# Patient Record
Sex: Male | Born: 1965
Health system: Southern US, Community
[De-identification: ages and names within clinical notes are randomized; demographics above are authoritative.]

## PROBLEM LIST (undated history)

## (undated) DIAGNOSIS — H409 Unspecified glaucoma: Secondary | ICD-10-CM

## (undated) DIAGNOSIS — Z86718 Personal history of other venous thrombosis and embolism: Secondary | ICD-10-CM

## (undated) DIAGNOSIS — G4730 Sleep apnea, unspecified: Secondary | ICD-10-CM

## (undated) DIAGNOSIS — E119 Type 2 diabetes mellitus without complications: Secondary | ICD-10-CM

## (undated) DIAGNOSIS — I10 Essential (primary) hypertension: Secondary | ICD-10-CM

## (undated) DIAGNOSIS — F419 Anxiety disorder, unspecified: Secondary | ICD-10-CM

## (undated) DIAGNOSIS — D689 Coagulation defect, unspecified: Secondary | ICD-10-CM

## (undated) DIAGNOSIS — Z86711 Personal history of pulmonary embolism: Secondary | ICD-10-CM

## (undated) DIAGNOSIS — K5720 Diverticulitis of large intestine with perforation and abscess without bleeding: Secondary | ICD-10-CM

## (undated) HISTORY — DX: Unspecified glaucoma: H40.9

## (undated) HISTORY — PX: MANDIBLE FRACTURE SURGERY: SHX706

## (undated) HISTORY — DX: Personal history of other venous thrombosis and embolism: Z86.718

## (undated) HISTORY — DX: Personal history of pulmonary embolism: Z86.711

## (undated) HISTORY — PX: FRACTURE SURGERY: SHX138

---

## 2006-05-13 HISTORY — PX: IVC FILTER INSERTION: CATH118245

## 2006-10-07 ENCOUNTER — Other Ambulatory Visit: Payer: Self-pay

## 2006-10-07 ENCOUNTER — Inpatient Hospital Stay: Payer: Self-pay | Admitting: Internal Medicine

## 2006-10-07 ENCOUNTER — Ambulatory Visit: Payer: Self-pay | Admitting: Internal Medicine

## 2006-10-12 ENCOUNTER — Ambulatory Visit: Payer: Self-pay | Admitting: Internal Medicine

## 2006-10-23 ENCOUNTER — Ambulatory Visit: Payer: Self-pay | Admitting: Internal Medicine

## 2006-11-11 ENCOUNTER — Ambulatory Visit: Payer: Self-pay | Admitting: Internal Medicine

## 2006-11-18 ENCOUNTER — Ambulatory Visit: Payer: Self-pay | Admitting: Vascular Surgery

## 2007-04-13 ENCOUNTER — Ambulatory Visit: Payer: Self-pay | Admitting: Internal Medicine

## 2007-04-21 ENCOUNTER — Ambulatory Visit: Payer: Self-pay | Admitting: Internal Medicine

## 2007-05-14 ENCOUNTER — Ambulatory Visit: Payer: Self-pay | Admitting: Internal Medicine

## 2007-10-21 ENCOUNTER — Ambulatory Visit: Payer: Self-pay | Admitting: Family Medicine

## 2008-06-13 ENCOUNTER — Ambulatory Visit: Payer: Self-pay | Admitting: Internal Medicine

## 2008-06-27 ENCOUNTER — Inpatient Hospital Stay: Payer: Self-pay | Admitting: Internal Medicine

## 2008-07-05 ENCOUNTER — Ambulatory Visit: Payer: Self-pay | Admitting: Internal Medicine

## 2008-07-11 ENCOUNTER — Ambulatory Visit: Payer: Self-pay | Admitting: Internal Medicine

## 2008-08-11 ENCOUNTER — Ambulatory Visit: Payer: Self-pay | Admitting: Internal Medicine

## 2008-09-10 ENCOUNTER — Ambulatory Visit: Payer: Self-pay | Admitting: Internal Medicine

## 2008-10-11 ENCOUNTER — Ambulatory Visit: Payer: Self-pay | Admitting: Internal Medicine

## 2008-11-10 ENCOUNTER — Ambulatory Visit: Payer: Self-pay | Admitting: Internal Medicine

## 2008-12-11 ENCOUNTER — Ambulatory Visit: Payer: Self-pay | Admitting: Internal Medicine

## 2009-01-11 ENCOUNTER — Ambulatory Visit: Payer: Self-pay | Admitting: Internal Medicine

## 2009-02-10 ENCOUNTER — Ambulatory Visit: Payer: Self-pay | Admitting: Internal Medicine

## 2009-03-13 ENCOUNTER — Ambulatory Visit: Payer: Self-pay | Admitting: Internal Medicine

## 2009-04-12 ENCOUNTER — Ambulatory Visit: Payer: Self-pay | Admitting: Internal Medicine

## 2009-05-13 ENCOUNTER — Ambulatory Visit: Payer: Self-pay | Admitting: Internal Medicine

## 2009-06-13 ENCOUNTER — Ambulatory Visit: Payer: Self-pay | Admitting: Internal Medicine

## 2009-07-11 ENCOUNTER — Ambulatory Visit: Payer: Self-pay | Admitting: Internal Medicine

## 2009-08-11 ENCOUNTER — Ambulatory Visit: Payer: Self-pay | Admitting: Internal Medicine

## 2009-09-10 ENCOUNTER — Ambulatory Visit: Payer: Self-pay | Admitting: Internal Medicine

## 2009-10-11 ENCOUNTER — Ambulatory Visit: Payer: Self-pay | Admitting: Internal Medicine

## 2009-11-10 ENCOUNTER — Ambulatory Visit: Payer: Self-pay | Admitting: Internal Medicine

## 2009-12-11 ENCOUNTER — Ambulatory Visit: Payer: Self-pay | Admitting: Internal Medicine

## 2010-01-11 ENCOUNTER — Ambulatory Visit: Payer: Self-pay | Admitting: Internal Medicine

## 2010-02-10 ENCOUNTER — Ambulatory Visit: Payer: Self-pay | Admitting: Internal Medicine

## 2010-03-13 ENCOUNTER — Ambulatory Visit: Payer: Self-pay | Admitting: Internal Medicine

## 2010-04-12 ENCOUNTER — Ambulatory Visit: Payer: Self-pay | Admitting: Internal Medicine

## 2011-10-14 ENCOUNTER — Emergency Department: Payer: Self-pay | Admitting: Emergency Medicine

## 2011-10-14 LAB — COMPREHENSIVE METABOLIC PANEL
Albumin: 3.9 g/dL (ref 3.4–5.0)
Alkaline Phosphatase: 64 U/L (ref 50–136)
Anion Gap: 8 (ref 7–16)
BUN: 13 mg/dL (ref 7–18)
Bilirubin,Total: 1.6 mg/dL — ABNORMAL HIGH (ref 0.2–1.0)
Calcium, Total: 9 mg/dL (ref 8.5–10.1)
Chloride: 101 mmol/L (ref 98–107)
Co2: 31 mmol/L (ref 21–32)
Creatinine: 1.31 mg/dL — ABNORMAL HIGH (ref 0.60–1.30)
EGFR (African American): >60
EGFR (Non-African Amer.): >60
Glucose: 105 mg/dL — ABNORMAL HIGH (ref 65–99)
Osmolality: 280 (ref 275–301)
Potassium: 3.4 mmol/L — ABNORMAL LOW (ref 3.5–5.1)
SGOT(AST): 36 U/L (ref 15–37)
SGPT (ALT): 33 U/L
Sodium: 140 mmol/L (ref 136–145)
Total Protein: 7.4 g/dL (ref 6.4–8.2)

## 2011-10-14 LAB — CBC
HCT: 45.5 % (ref 40.0–52.0)
HGB: 15 g/dL (ref 13.0–18.0)
MCH: 28.9 pg (ref 26.0–34.0)
MCV: 88 fL (ref 80–100)
Platelet: 174 10*3/uL (ref 150–440)
RBC: 5.2 10*6/uL (ref 4.40–5.90)
RDW: 15.5 % — ABNORMAL HIGH (ref 11.5–14.5)

## 2011-10-14 LAB — PROTIME-INR
INR: 2.5
Prothrombin Time: 27 secs — ABNORMAL HIGH (ref 11.5–14.7)

## 2012-08-11 ENCOUNTER — Ambulatory Visit: Payer: Self-pay | Admitting: Nurse Practitioner

## 2012-08-21 ENCOUNTER — Other Ambulatory Visit: Payer: Self-pay | Admitting: Gastroenterology

## 2013-06-29 ENCOUNTER — Ambulatory Visit: Payer: Self-pay | Admitting: Urology

## 2013-07-09 ENCOUNTER — Ambulatory Visit: Payer: Self-pay | Admitting: Physician Assistant

## 2013-07-11 HISTORY — PX: UMBILICAL GRANULOMA EXCISION: SHX2597

## 2013-07-12 ENCOUNTER — Ambulatory Visit: Payer: Self-pay | Admitting: Physician Assistant

## 2013-07-13 LAB — WOUND CULTURE

## 2013-07-21 ENCOUNTER — Ambulatory Visit: Payer: Self-pay | Admitting: Anesthesiology

## 2013-07-21 LAB — CBC
HCT: 46.4 % (ref 40.0–52.0)
HGB: 15.2 g/dL (ref 13.0–18.0)
MCH: 27.7 pg (ref 26.0–34.0)
MCHC: 32.8 g/dL (ref 32.0–36.0)
MCV: 84 fL (ref 80–100)
Platelet: 188 10*3/uL (ref 150–440)
RBC: 5.49 10*6/uL (ref 4.40–5.90)
RDW: 15.6 % — AB (ref 11.5–14.5)
WBC: 5.3 10*3/uL (ref 3.8–10.6)

## 2013-07-21 LAB — BASIC METABOLIC PANEL
Anion Gap: 5 — ABNORMAL LOW (ref 7–16)
BUN: 18 mg/dL (ref 7–18)
CREATININE: 1.51 mg/dL — AB (ref 0.60–1.30)
Calcium, Total: 9.5 mg/dL (ref 8.5–10.1)
Chloride: 105 mmol/L (ref 98–107)
Co2: 28 mmol/L (ref 21–32)
GFR CALC NON AF AMER: 55 — AB
Glucose: 80 mg/dL (ref 65–99)
Osmolality: 277 (ref 275–301)
POTASSIUM: 3.7 mmol/L (ref 3.5–5.1)
Sodium: 138 mmol/L (ref 136–145)

## 2013-07-23 ENCOUNTER — Ambulatory Visit: Payer: Self-pay | Admitting: Specialist

## 2013-07-23 LAB — PROTIME-INR
INR: 1.1
Prothrombin Time: 14.5 secs (ref 11.5–14.7)

## 2013-07-26 LAB — PATHOLOGY REPORT

## 2014-02-14 ENCOUNTER — Emergency Department: Payer: Self-pay | Admitting: Emergency Medicine

## 2014-02-14 LAB — COMPREHENSIVE METABOLIC PANEL
ALBUMIN: 3.9 g/dL (ref 3.4–5.0)
ALK PHOS: 64 U/L
ANION GAP: 6 — AB (ref 7–16)
BUN: 14 mg/dL (ref 7–18)
Bilirubin,Total: 1.3 mg/dL — ABNORMAL HIGH (ref 0.2–1.0)
CHLORIDE: 103 mmol/L (ref 98–107)
CO2: 31 mmol/L (ref 21–32)
CREATININE: 1.38 mg/dL — AB (ref 0.60–1.30)
Calcium, Total: 9 mg/dL (ref 8.5–10.1)
EGFR (African American): >60
EGFR (Non-African Amer.): 59 — ABNORMAL LOW
Glucose: 105 mg/dL — ABNORMAL HIGH (ref 65–99)
OSMOLALITY: 280 (ref 275–301)
POTASSIUM: 3.6 mmol/L (ref 3.5–5.1)
SGOT(AST): 23 U/L (ref 15–37)
SGPT (ALT): 37 U/L
Sodium: 140 mmol/L (ref 136–145)
Total Protein: 7.5 g/dL (ref 6.4–8.2)

## 2014-02-14 LAB — CBC WITH DIFFERENTIAL/PLATELET
BASOS PCT: 1 %
Basophil #: 0.1 10*3/uL (ref 0.0–0.1)
Eosinophil #: 0.2 10*3/uL (ref 0.0–0.7)
Eosinophil %: 3.7 %
HCT: 41.4 % (ref 40.0–52.0)
HGB: 13.7 g/dL (ref 13.0–18.0)
LYMPHS ABS: 1.7 10*3/uL (ref 1.0–3.6)
Lymphocyte %: 29.6 %
MCH: 28.6 pg (ref 26.0–34.0)
MCHC: 33.1 g/dL (ref 32.0–36.0)
MCV: 86 fL (ref 80–100)
MONO ABS: 0.4 x10 3/mm (ref 0.2–1.0)
MONOS PCT: 7.4 %
NEUTROS PCT: 58.3 %
Neutrophil #: 3.3 10*3/uL (ref 1.4–6.5)
Platelet: 186 10*3/uL (ref 150–440)
RBC: 4.8 10*6/uL (ref 4.40–5.90)
RDW: 16 % — AB (ref 11.5–14.5)
WBC: 5.6 10*3/uL (ref 3.8–10.6)

## 2014-02-14 LAB — APTT: Activated PTT: 36.7 secs — ABNORMAL HIGH (ref 23.6–35.9)

## 2014-02-14 LAB — PROTIME-INR
INR: 2.2
Prothrombin Time: 23.7 secs — ABNORMAL HIGH (ref 11.5–14.7)

## 2014-02-14 LAB — TROPONIN I: Troponin-I: <0.02 ng/mL

## 2014-02-27 HISTORY — PX: OTHER SURGICAL HISTORY: SHX169

## 2014-02-28 HISTORY — PX: EYE SURGERY: SHX253

## 2014-09-03 NOTE — Op Note (Signed)
PATIENT NAME:  Bruce Jenkins, Bruce Jenkins MR#:  [id number] DATE OF BIRTH:  06-25-1965  DATE OF OPERATION:  07/23/2013  PREOPERATIVE DIAGNOSIS: Volar right hand mass.   POSTOPERATIVE DIAGNOSIS: Probable hemangioma volar right hand mass.   PROCEDURE:  Deep excision of volar right hand mass.   DESCRIPTION OF PROCEDURE: Two grams of Ancef was given intravenously prior to the procedure. General anesthesia is induced. The right hand and upper extremity are thoroughly prepped with alcohol and ChloraPrep and draped in standard sterile fashion. The extremity is carefully wrapped out with the Esmarch bandage and pneumatic tourniquet elevated to 250 mmHg. The mass is seen to be protruding from the skin and extending down deep in the mid palm. Careful elliptical incision is made longitudinally around the edges of the mass, and then the skin edges proximally were mobilized and the dissection was carefully carried out to the mass under the loupe magnification. Careful preservation of the digital nerves and arteries and the flexor tendons was taken. The mass is completely dissected out and is seen to be a probable hemangioma. Careful search for any residual portions of hemangioma is made, and none are seen. Coagulation is performed carefully with the Bovie. Specimen is sent to Pathology. The wound is thoroughly irrigated multiple times. The skin edges are carefully undermined with the scissors. The skin is closed over a large vessel loop drain, which is brought out proximally. Sutures used are 4-0 nylon. A soft bulky dressing is applied along with a volar splint. The arm is elevated and then the tourniquet is released. The patient is returned to the recovery room in satisfactory condition, having tolerated the procedure quite well.    ____________________________ Lucas Mallow, MD ces:mr D: 07/24/2013 10:06:16 ET T: 07/24/2013 21:24:57 ET JOB#: [id number]  cc: Lucas Mallow, MD, <Dictator> Lucas Mallow  MD ELECTRONICALLY SIGNED 07/28/2013 8:32

## 2014-09-07 DIAGNOSIS — I10 Essential (primary) hypertension: Secondary | ICD-10-CM | POA: Insufficient documentation

## 2014-09-07 DIAGNOSIS — D689 Coagulation defect, unspecified: Secondary | ICD-10-CM | POA: Insufficient documentation

## 2014-09-13 DIAGNOSIS — H401113 Primary open-angle glaucoma, right eye, severe stage: Secondary | ICD-10-CM | POA: Insufficient documentation

## 2014-12-20 DIAGNOSIS — H40022 Open angle with borderline findings, high risk, left eye: Secondary | ICD-10-CM | POA: Insufficient documentation

## 2015-12-29 IMAGING — CR DG CHEST 2V
1 series · 2 of 2 positions shown · non-contrast
Comparison: PA and lateral chest x-ray October 07, 2006 and CT scan
of the chest of June 27, 2008.

CLINICAL DATA: Increasing shortness of breath for 1 week with right
mid back pain; no cough or fever; history of previous pulmonary
embolism in 6441 and in 0440; history of pneumothorax in Thursday July, 2013

EXAM:
CHEST  2 VIEW

[Series 1: dxr chest pa (or ap) and lateral · 0.14mm/px · 2 of 2 slices shown]
[im 1/2]
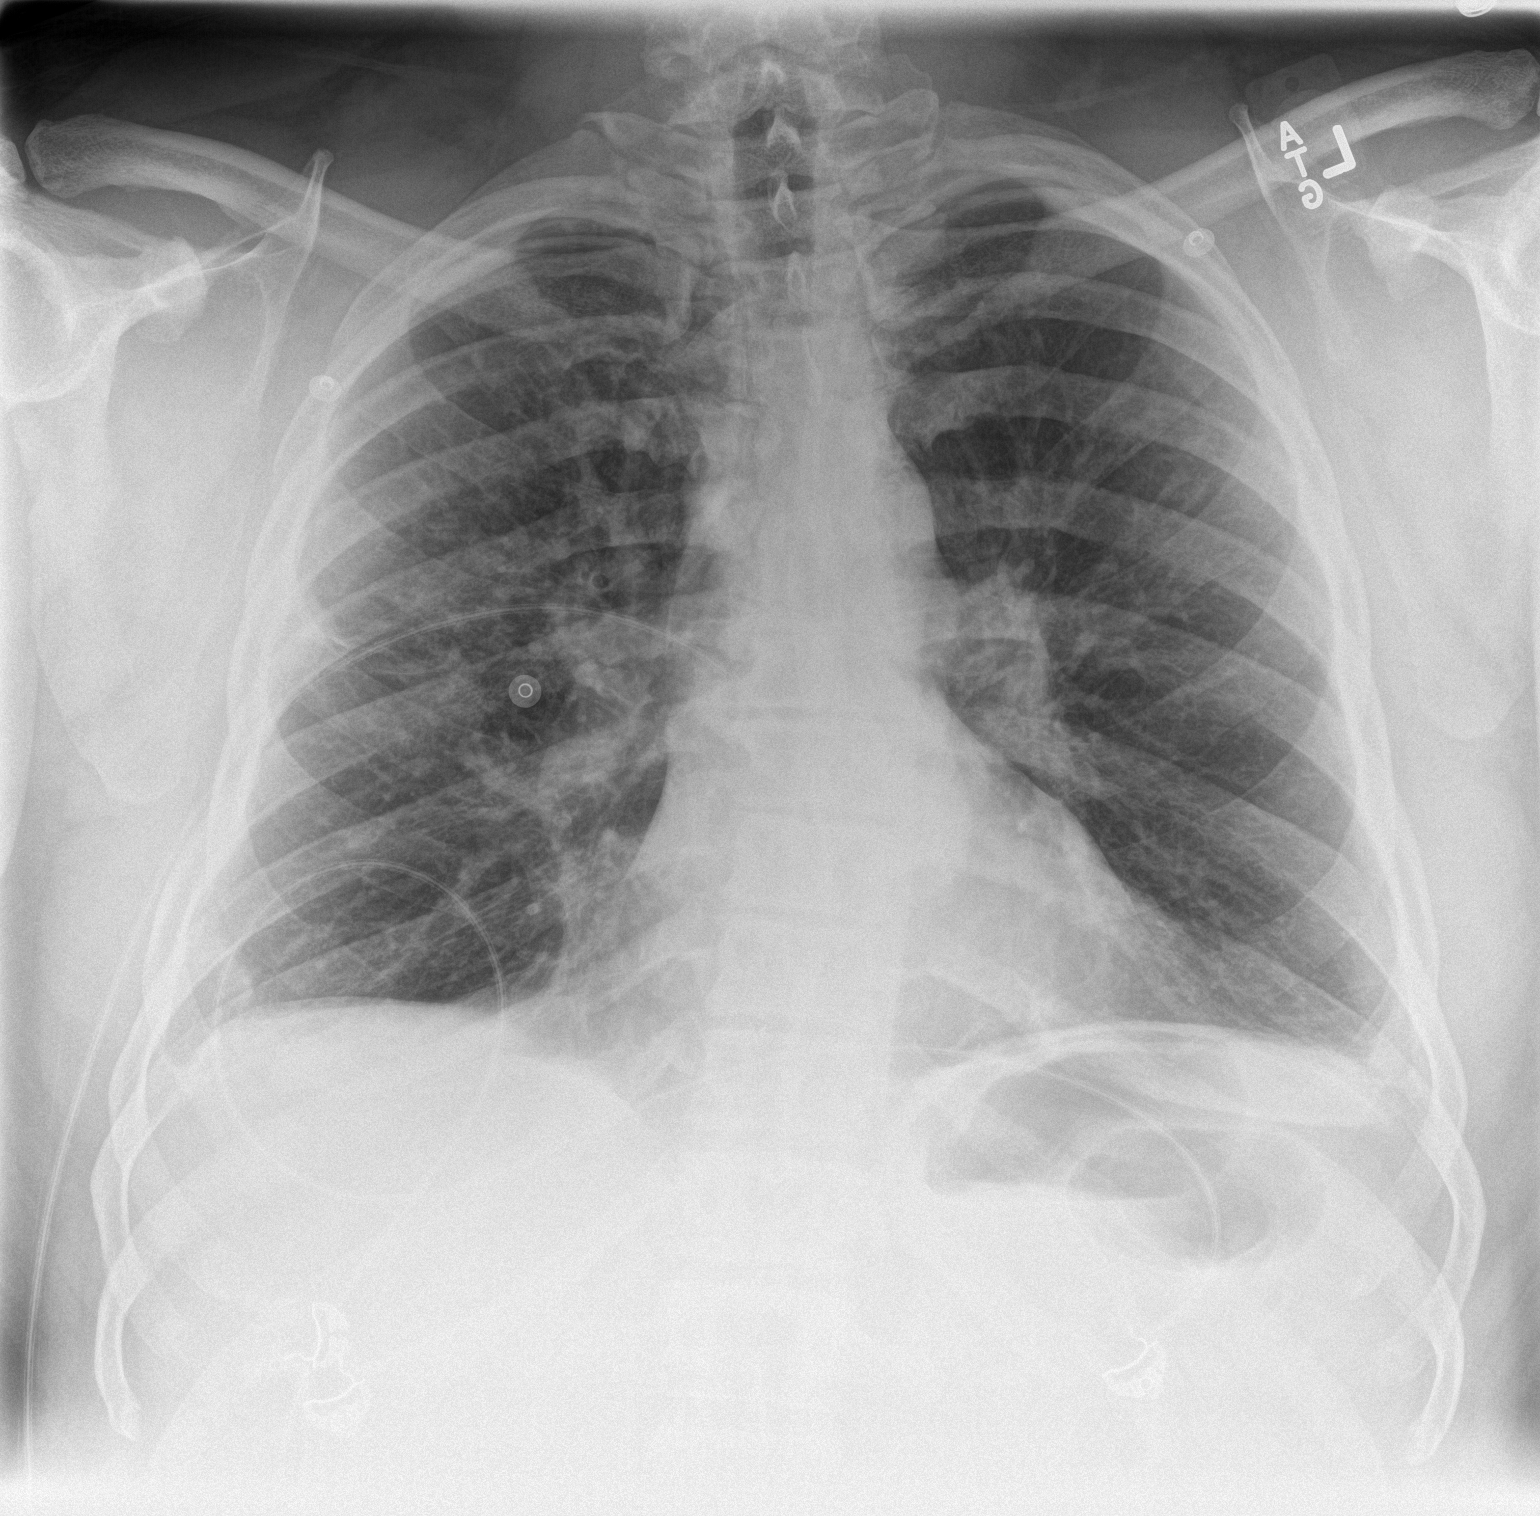
[im 2/2]
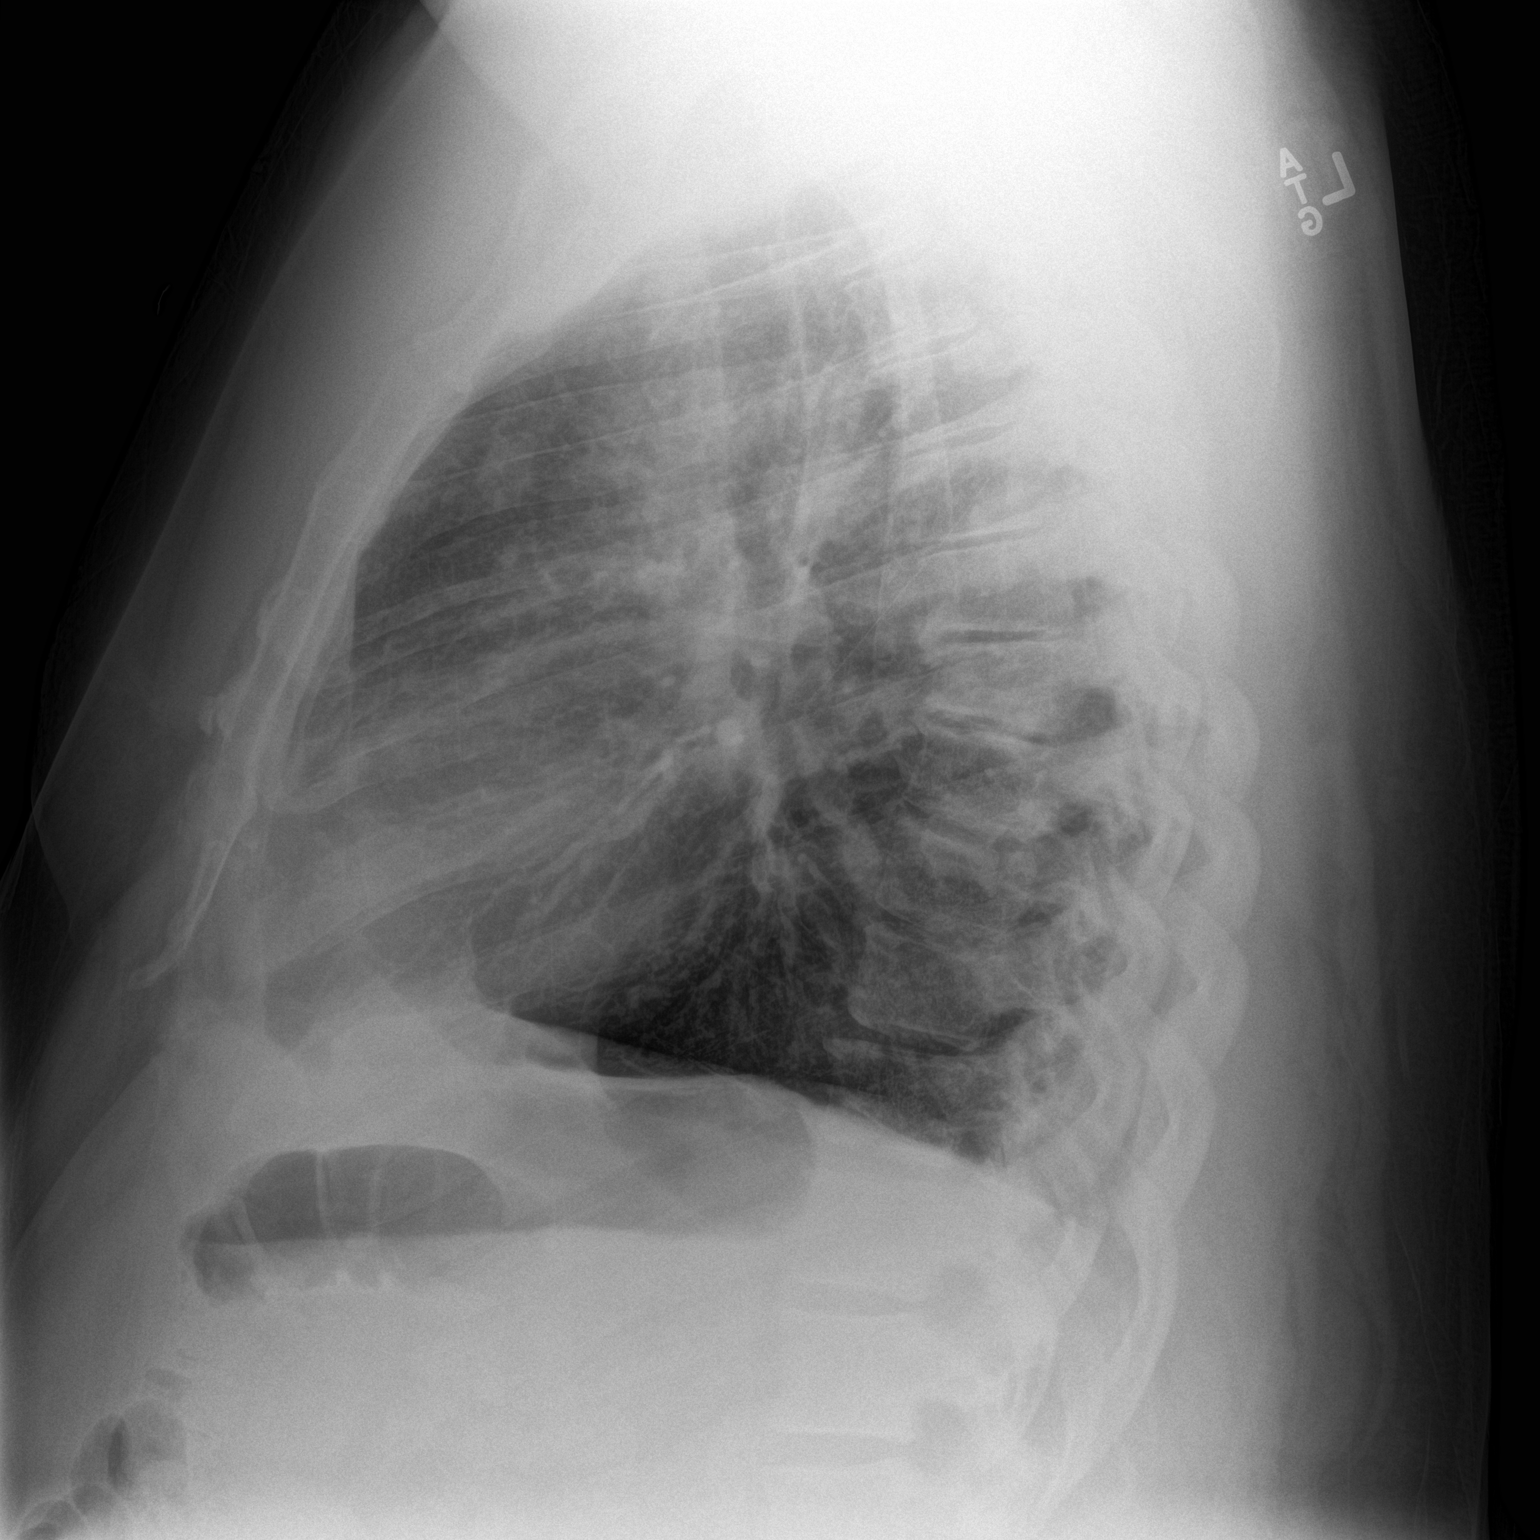

[2 of 2 positions shown; findings below may reference images not displayed]

FINDINGS: The lungs are well-expanded with mild hemidiaphragm flattening.
There is a small amount of pleural fluid blunting the costophrenic
angles bilaterally. The interstitial markings are increased in a
reticulonodular pattern. There is no alveolar infiltrate. The heart
and pulmonary vascularity are normal. The mediastinum is normal in
width. The bony thorax exhibits mild degenerative disc change at
multiple thoracic levels.
IMPRESSION: COPD or reactive airway disease. Superimposed interstitial pneumonia
is suspected. Some of the areas of interstitial density have a
nodular configuration. Given the patient's clinical history and past
history of pulmonary emboli, chest CT scanning now may be useful in
further assessing the pulmonary interstitium.

## 2016-05-07 ENCOUNTER — Encounter: Payer: 59 | Attending: Family Medicine | Admitting: Dietician

## 2016-05-07 ENCOUNTER — Encounter: Payer: Self-pay | Admitting: Dietician

## 2016-05-07 VITALS — BP 122/78 | Ht 72.0 in | Wt 318.0 lb

## 2016-05-07 DIAGNOSIS — E119 Type 2 diabetes mellitus without complications: Secondary | ICD-10-CM | POA: Diagnosis not present

## 2016-05-07 DIAGNOSIS — Z713 Dietary counseling and surveillance: Secondary | ICD-10-CM | POA: Diagnosis present

## 2016-05-07 NOTE — Progress Notes (Signed)
Diabetes Self-Management Education  Visit Type: First/Initial  Appt. Start Time: 1100 Appt. End Time: 1200  05/07/2016  [name], identified by name and date of birth, is a 50 y.o. male with a diagnosis of Diabetes: Type 2.   ASSESSMENT  Blood pressure 122/78, height 6' (1.829 m), weight (!) 318 lb (144.2 kg). Body mass index is 43.13 kg/m.  Obesity      Diabetes Self-Management Education - 05/07/16 1334      Visit Information   Visit Type First/Initial     Initial Visit   Diabetes Type Type 2     Health Coping   How would you rate your overall health? Good     Psychosocial Assessment   Patient Belief/Attitude about Diabetes Motivated to manage diabetes   Self-care barriers None   Patient Concerns Glycemic Control;Healthy Lifestyle;Weight Control  prevent complications   Special Needs None   Preferred Learning Style Auditory;Visual;Hands on   Learning Readiness Ready   What is the last grade level you completed in school? AA degree     Pre-Education Assessment   Patient understands the diabetes disease and treatment process. Needs Instruction   Patient understands incorporating nutritional management into lifestyle. Needs Instruction   Patient undertands incorporating physical activity into lifestyle. Needs Instruction   Patient understands using medications safely. Needs Instruction   Patient understands monitoring blood glucose, interpreting and using results Needs Instruction   Patient understands prevention, detection, and treatment of acute complications. Needs Instruction   Patient understands prevention, detection, and treatment of chronic complications. Needs Instruction   Patient understands how to develop strategies to address psychosocial issues. Needs Instruction   Patient understands how to develop strategies to promote health/change behavior. Needs Instruction     Complications   Last HgB A1C per patient/outside source 6.5 %   How often do  you check your blood sugar? 0 times/day (not testing)   Have you had a dilated eye exam in the past 12 months? Yes  09-2015   Have you had a dental exam in the past 12 months? No  about 20 years ago   Are you checking your feet? Yes   How many days per week are you checking your feet? 7     Dietary Intake   Breakfast --  eats at 9:30a at work; eats out 2x/wk   Snack (morning) --  none   Lunch --  eats at 12p=eats fried foods daily; eats out 6x/wk   Snack (afternoon) --  eats occasional nuts or seeds   Dinner --  eats at 7p; eats out 3x/wk   Snack (evening) --  eats occasional nuts or seeds   Beverage(s) --  drinks 6-7 glasses of water/day and sugar free beverages 4-5x/day; fruit juice 1x/day     Exercise   Exercise Type ADL's     Patient Education   Previous Diabetes Education No   Disease state  Definition of diabetes, type 1 and 2, and the diagnosis of diabetes;Factors that contribute to the development of diabetes;Explored patient's options for treatment of their diabetes   Nutrition management  Role of diet in the treatment of diabetes and the relationship between the three main macronutrients and blood glucose level;Food label reading, portion sizes and measuring food.;Carbohydrate counting   Physical activity and exercise  Role of exercise on diabetes management, blood pressure control and cardiac health.;Helped patient identify appropriate exercises in relation to his/her diabetes, diabetes complications and other health issue.   Monitoring Purpose and frequency  of SMBG.;Identified appropriate SMBG and/or A1C goals.;Taught/evaluated SMBG meter.;Taught/discussed recording of test results and interpretation of SMBG.;Yearly dilated eye exam  gave pt One Touch Verio Flex meter and instructed on its use=BG 82 ac lunch   Chronic complications Relationship between chronic complications and blood glucose control;Dental care;Retinopathy and reason for yearly dilated eye exams    Psychosocial adjustment Role of stress on diabetes   Personal strategies to promote health Lifestyle issues that need to be addressed for better diabetes care;Helped patient develop diabetes management plan for (enter comment)      Individualized Plan for Diabetes Self-Management Training:   Learning Objective:  Patient will have a greater understanding of diabetes self-management. Patient education plan is to attend individual and/or group sessions per assessed needs and concerns.   Plan:   Patient Instructions   Check blood sugars 2 x day before breakfast and 2 hrs after supper every day Exercise:  Walk 15-20   minutes  4-5  days a week Avoid sugar sweetened drinks (soda, tea, coffee, sports drinks, juices) Limit intake of fried foods and sweets/desserts Eat 3-4 carbohydrate servings/meal + protein Eat 1 carbohydrate serving/snack + protein Eat 3 meals day,  2 snacks a day in afternoon and at bedtime Make healthy food choices Space meals 4-6 hours apart Make a  dentist  appointment Bring blood sugar records to the next appointment/class Call your doctor for a prescription for:  1. Meter strips (type)One Touch Verio Flex test strips checking 2 times per day  2. Lancets (type)  One Touch Delica Lancets checking 2  times per day Get a Sharps container Return for appointment/classes on: 05-20-16   Expected Outcomes:   positive  Education material provided: General Meal Planning Guidelines, One Touch Verio Flex meter  If problems or questions, patient to contact team via:  [phone]  Future DSME appointment:  05-20-16

## 2016-05-07 NOTE — Patient Instructions (Signed)
  Check blood sugars 2 x day before breakfast and 2 hrs after supper every day Exercise:  Walk 15-20   minutes  4-5  days a week Avoid sugar sweetened drinks (soda, tea, coffee, sports drinks, juices) Limit intake of fried foods and sweets/desserts Eat 3-4 carbohydrate servings/meal + protein Eat 1 carbohydrate serving/snack + protein Eat 3 meals day,  2 snacks a day in afternoon and at bedtime Make healthy food choices Space meals 4-6 hours apart Make a  dentist  appointment Bring blood sugar records to the next appointment/class Call your doctor for a prescription for:  1. Meter strips (type)One Touch Verio Flex test strips checking 2 times per day  2. Lancets (type)  One Touch Delica Lancets checking 2  times per day Get a Sharps container Return for appointment/classes on: 05-20-16

## 2016-05-10 DIAGNOSIS — E119 Type 2 diabetes mellitus without complications: Secondary | ICD-10-CM | POA: Insufficient documentation

## 2016-05-20 ENCOUNTER — Encounter: Payer: 59 | Attending: Family Medicine | Admitting: Dietician

## 2016-05-20 ENCOUNTER — Encounter: Payer: Self-pay | Admitting: Dietician

## 2016-05-20 VITALS — Ht 72.0 in | Wt 320.9 lb

## 2016-05-20 DIAGNOSIS — Z713 Dietary counseling and surveillance: Secondary | ICD-10-CM | POA: Diagnosis not present

## 2016-05-20 DIAGNOSIS — E119 Type 2 diabetes mellitus without complications: Secondary | ICD-10-CM | POA: Diagnosis not present

## 2016-05-20 NOTE — Progress Notes (Signed)

## 2016-05-27 ENCOUNTER — Encounter: Payer: 59 | Admitting: Dietician

## 2016-05-27 ENCOUNTER — Encounter: Payer: Self-pay | Admitting: Dietician

## 2016-05-27 VITALS — Wt 317.2 lb

## 2016-05-27 DIAGNOSIS — Z713 Dietary counseling and surveillance: Secondary | ICD-10-CM | POA: Diagnosis not present

## 2016-05-27 DIAGNOSIS — E119 Type 2 diabetes mellitus without complications: Secondary | ICD-10-CM

## 2016-05-27 NOTE — Progress Notes (Signed)

## 2016-06-03 ENCOUNTER — Encounter: Payer: Self-pay | Admitting: Dietician

## 2016-06-03 ENCOUNTER — Encounter: Payer: 59 | Admitting: Dietician

## 2016-06-03 VITALS — BP 118/80 | Ht 72.0 in | Wt 314.8 lb

## 2016-06-03 DIAGNOSIS — E119 Type 2 diabetes mellitus without complications: Secondary | ICD-10-CM

## 2016-06-03 DIAGNOSIS — Z713 Dietary counseling and surveillance: Secondary | ICD-10-CM | POA: Diagnosis not present

## 2016-06-03 NOTE — Progress Notes (Signed)

## 2016-06-25 ENCOUNTER — Encounter: Payer: Self-pay | Admitting: Dietician

## 2016-11-06 ENCOUNTER — Emergency Department
Admission: EM | Admit: 2016-11-06 | Discharge: 2016-11-07 | Disposition: A | Discharge status: Home / Self Care | Payer: 59 | Attending: Emergency Medicine | Admitting: Emergency Medicine

## 2016-11-06 ENCOUNTER — Emergency Department: Payer: 59

## 2016-11-06 DIAGNOSIS — Z79899 Other long term (current) drug therapy: Secondary | ICD-10-CM | POA: Insufficient documentation

## 2016-11-06 DIAGNOSIS — I809 Phlebitis and thrombophlebitis of unspecified site: Secondary | ICD-10-CM

## 2016-11-06 DIAGNOSIS — Z7901 Long term (current) use of anticoagulants: Secondary | ICD-10-CM | POA: Diagnosis not present

## 2016-11-06 DIAGNOSIS — Z87891 Personal history of nicotine dependence: Secondary | ICD-10-CM | POA: Insufficient documentation

## 2016-11-06 DIAGNOSIS — R2241 Localized swelling, mass and lump, right lower limb: Secondary | ICD-10-CM | POA: Diagnosis present

## 2016-11-06 LAB — BASIC METABOLIC PANEL
ANION GAP: 7 (ref 5–15)
BUN: 18 mg/dL (ref 6–20)
CALCIUM: 9.5 mg/dL (ref 8.9–10.3)
CO2: 31 mmol/L (ref 22–32)
Chloride: 104 mmol/L (ref 101–111)
Creatinine, Ser: 1.28 mg/dL — ABNORMAL HIGH (ref 0.61–1.24)
GFR calc Af Amer: >60 mL/min (ref >60)
GFR calc non Af Amer: >60 mL/min (ref >60)
GLUCOSE: 109 mg/dL — AB (ref 65–99)
POTASSIUM: 3.6 mmol/L (ref 3.5–5.1)
Sodium: 142 mmol/L (ref 135–145)

## 2016-11-06 LAB — CBC
HEMATOCRIT: 41.1 % (ref 40.0–52.0)
HEMOGLOBIN: 13.9 g/dL (ref 13.0–18.0)
MCH: 27.7 pg (ref 26.0–34.0)
MCHC: 33.9 g/dL (ref 32.0–36.0)
MCV: 81.7 fL (ref 80.0–100.0)
Platelets: 179 10*3/uL (ref 150–440)
RBC: 5.03 MIL/uL (ref 4.40–5.90)
RDW: 16.9 % — ABNORMAL HIGH (ref 11.5–14.5)
WBC: 6.2 10*3/uL (ref 3.8–10.6)

## 2016-11-06 LAB — PROTIME-INR
INR: 2.24
Prothrombin Time: 25.2 seconds — ABNORMAL HIGH (ref 11.4–15.2)

## 2016-11-06 NOTE — ED Triage Notes (Addendum)
Pt presents to ED via POV with c/o RIGHT ankle/ lower calf swelling and pain x3 days. Pt denies recent injury or trauma to the area, h/x of multiple DVTs in the past. CMS intact, area of swelling is slightly warm to touch, tender to palpitation. Pt is currently taking Warfarin.

## 2016-11-07 MED ORDER — LIDOCAINE 5 % EX PTCH
1.0000 | MEDICATED_PATCH | CUTANEOUS | Status: DC
  Administered 2016-11-07: 1 via TRANSDERMAL
  Filled 2016-11-07: qty 1

## 2016-11-07 MED ORDER — CEPHALEXIN 500 MG PO CAPS: 500.0000 mg | ORAL_CAPSULE | Freq: Two times a day (BID) | ORAL | 0 refills | Status: AC

## 2016-11-07 MED ORDER — CEPHALEXIN 500 MG PO CAPS
500.0000 mg | ORAL_CAPSULE | Freq: Once | ORAL | Status: AC
  Administered 2016-11-07: 500 mg via ORAL
  Filled 2016-11-07: qty 1

## 2016-11-07 NOTE — ED Provider Notes (Signed)
[facility] Emergency Department Provider Note    First MD Initiated Contact with Patient 11/06/16 2358     (approximate)  I have reviewed the triage vital signs and the nursing notes.   HISTORY  Chief Complaint Leg Swelling   HPI Bruce Jenkins. is a 51 y.o. 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 Patient states that the pain is located on the distal right leg just anterior to the ankle in the area of a varicose vein. Patient states area is very tender to the touch. Patient denies any fever. Patient denies any chest pain or shortness of breath   Past Medical History:  Diagnosis Date  . Glaucoma   . History of DVT in adulthood   . History of pulmonary embolus (PE)     There are no active problems to display for this patient.   History reviewed. No pertinent surgical history.  Prior to Admission medications   Medication Sig Start Date End Date Taking? Authorizing Provider  chlorthalidone (HYGROTON) 25 MG tablet Take 25 mg by mouth daily.    [provider]  escitalopram (LEXAPRO) 10 MG tablet Take 10 mg by mouth daily.    [provider]  latanoprost (XALATAN) 0.005 % ophthalmic solution Place 1 drop into the right eye at bedtime.    [provider]  potassium chloride SA (K-DUR,KLOR-CON) 20 MEQ tablet Take 20 mEq by mouth daily.    [provider]  warfarin (COUMADIN) 7.5 MG tablet Take 7.5 mg by mouth daily. TAKE 1.5 tablet every Sunday and Thursday    [provider]    Allergies No known drug allergies   No family history on file.  Social History Social History  Substance Use Topics  . Smoking status: Former Research scientist (life sciences)  . Smokeless tobacco: Never Used  . Alcohol use No    Review of Systems Constitutional: No  fever/chills Eyes: No visual changes. ENT: No sore throat. Cardiovascular: Denies chest pain. Respiratory: Denies shortness of breath. Gastrointestinal: No abdominal pain.  No nausea, no vomiting.  No diarrhea.  No constipation. Genitourinary: Negative for dysuria. Musculoskeletal: Negative for neck pain.  Negative for back pain.Positive for right lower extremity pain Integumentary: Negative for rash. Neurological: Negative for headaches, focal weakness or numbness.   ____________________________________________   PHYSICAL EXAM:  VITAL SIGNS: ED Triage Vitals  Enc Vitals Group     BP 11/06/16 2006 (!) 149/79     Pulse Rate 11/07/16 0004 72     Resp 11/06/16 2006 18     Temp 11/06/16 2006 98.8 F (37.1 C)     Temp Source 11/06/16 2006 Oral     SpO2 11/06/16 2006 95 %     Weight 11/06/16 2007 133.8 kg (295 lb)     Height 11/06/16 2007 1.829 m (6')     Head Circumference --      Peak Flow --      Pain Score 11/06/16 2005 5     Pain Loc --      Pain Edu? --      Excl. in [place]? --     Constitutional: Alert and oriented. Well appearing and in no acute distress. Eyes: Conjunctivae are normal.  Head: Atraumatic. Mouth/Throat: Mucous membranes are moist.  Oropharynx non-erythematous. Neck: No stridor.   Cardiovascular: Normal rate, regular rhythm. Good peripheral circulation.  Grossly normal heart sounds. Respiratory: Normal respiratory effort.  No retractions. Lungs CTAB. Gastrointestinal: Soft and nontender. No distention.  Musculoskeletal: Pain anterior distal right lower extremity at site of varicose vein Neurologic:  Normal speech and language. No gross focal neurologic deficits are appreciated.  Skin:  Skin is warm, dry and intact. No rash noted. Psychiatric: Mood and affect are normal. Speech and behavior are normal.  ____________________________________________   LABS (all labs ordered are listed, but only abnormal results are displayed)  Labs Reviewed  BASIC  METABOLIC PANEL - Abnormal; Notable for the following:       Result Value   Glucose, Bld 109 (*)    Creatinine, Ser 1.28 (*)    All other components within normal limits  CBC - Abnormal; Notable for the following:    RDW 16.9 (*)    All other components within normal limits  PROTIME-INR - Abnormal; Notable for the following:    Prothrombin Time 25.2 (*)    All other components within normal limits   _  RADIOLOGY I,  N Little Bashore, personally viewed and evaluated these images (plain radiographs) as part of my medical decision making, as well as reviewing the written report by the radiologist.  US Venous Img Lower Unilateral Right  Result Date: 11/06/2016 CLINICAL DATA:  Right lower extremity swelling and pain for 3 days. EXAM: Right LOWER EXTREMITY VENOUS DOPPLER ULTRASOUND TECHNIQUE: Gray-scale sonography with graded compression, as well as color Doppler and duplex ultrasound were performed to evaluate the lower extremity deep venous systems from the level of the common femoral vein and including the common femoral, femoral, profunda femoral, popliteal and calf veins including the posterior tibial, peroneal and gastrocnemius veins when visible. The superficial great saphenous vein was also interrogated. Spectral Doppler was utilized to evaluate flow at rest and with distal augmentation maneuvers in the common femoral, femoral and popliteal veins. COMPARISON:  None. FINDINGS: Contralateral Common Femoral Vein: Respiratory phasicity is normal and symmetric with the symptomatic side. No evidence of thrombus. Normal compressibility. Common Femoral Vein: No evidence of thrombus. Normal compressibility, respiratory phasicity and response to augmentation. Saphenofemoral Junction: No evidence of thrombus. Normal compressibility and flow on color Doppler imaging. Profunda Femoral Vein: No evidence of thrombus. Normal compressibility and flow on color Doppler imaging. Femoral Vein: No evidence of  thrombus. Normal compressibility, respiratory phasicity and response to augmentation. Popliteal Vein: No evidence of thrombus. Normal compressibility, respiratory phasicity and response to augmentation. Calf Veins: Peroneal vein is not visible. The posterior tibial vein is patent. Superficial Great Saphenous Vein: No evidence of thrombus. Normal compressibility and flow on color Doppler imaging. Venous Reflux:  None. Other Findings:  None. IMPRESSION: No evidence of DVT within the right lower extremity. Electronically Signed   By: Andreas Newport M.D.   On: 11/06/2016 22:24    ____________________________________________    Procedures   ____________________________________________   INITIAL IMPRESSION / ASSESSMENT AND PLAN / ED COURSE  Pertinent labs & imaging results that were available during my care of the patient were reviewed by me and considered in my medical decision making (see chart for details).  History physical exam consistent with superficial phlebitis. Ultrasound revealed no evidence of DVT      ____________________________________________  FINAL CLINICAL IMPRESSION(S) / ED DIAGNOSES  Final diagnoses:  Superficial phlebitis     MEDICATIONS GIVEN DURING THIS VISIT:  Medications - No data to display   NEW OUTPATIENT MEDICATIONS STARTED DURING THIS VISIT:  New Prescriptions   No medications on file  Modified Medications   No medications on file    Discontinued Medications   No medications on file     Note:  This document was prepared using Dragon voice recognition software and may include unintentional dictation errors.    Gregor Hams, MD 11/07/16 [phone]

## 2016-11-18 DIAGNOSIS — Z7901 Long term (current) use of anticoagulants: Secondary | ICD-10-CM | POA: Diagnosis not present

## 2016-12-16 DIAGNOSIS — Z7901 Long term (current) use of anticoagulants: Secondary | ICD-10-CM | POA: Diagnosis not present

## 2017-01-14 DIAGNOSIS — Z7901 Long term (current) use of anticoagulants: Secondary | ICD-10-CM | POA: Diagnosis not present

## 2017-02-11 DIAGNOSIS — Z7901 Long term (current) use of anticoagulants: Secondary | ICD-10-CM | POA: Diagnosis not present

## 2017-02-17 DIAGNOSIS — E119 Type 2 diabetes mellitus without complications: Secondary | ICD-10-CM | POA: Diagnosis not present

## 2017-02-17 DIAGNOSIS — Z23 Encounter for immunization: Secondary | ICD-10-CM | POA: Diagnosis not present

## 2017-02-17 DIAGNOSIS — I10 Essential (primary) hypertension: Secondary | ICD-10-CM | POA: Diagnosis not present

## 2017-03-11 DIAGNOSIS — Z7901 Long term (current) use of anticoagulants: Secondary | ICD-10-CM | POA: Diagnosis not present

## 2017-03-11 DIAGNOSIS — I10 Essential (primary) hypertension: Secondary | ICD-10-CM | POA: Diagnosis not present

## 2017-03-11 DIAGNOSIS — E119 Type 2 diabetes mellitus without complications: Secondary | ICD-10-CM | POA: Diagnosis not present

## 2017-03-11 DIAGNOSIS — E781 Pure hyperglyceridemia: Secondary | ICD-10-CM | POA: Diagnosis not present

## 2017-03-31 DIAGNOSIS — Z7901 Long term (current) use of anticoagulants: Secondary | ICD-10-CM | POA: Diagnosis not present

## 2017-04-28 DIAGNOSIS — Z7901 Long term (current) use of anticoagulants: Secondary | ICD-10-CM | POA: Diagnosis not present

## 2017-05-26 DIAGNOSIS — Z7901 Long term (current) use of anticoagulants: Secondary | ICD-10-CM | POA: Diagnosis not present

## 2017-06-23 DIAGNOSIS — Z7901 Long term (current) use of anticoagulants: Secondary | ICD-10-CM | POA: Diagnosis not present

## 2017-07-21 DIAGNOSIS — Z7901 Long term (current) use of anticoagulants: Secondary | ICD-10-CM | POA: Diagnosis not present

## 2017-08-18 DIAGNOSIS — Z7901 Long term (current) use of anticoagulants: Secondary | ICD-10-CM | POA: Diagnosis not present

## 2017-08-27 DIAGNOSIS — Z7901 Long term (current) use of anticoagulants: Secondary | ICD-10-CM | POA: Diagnosis not present

## 2017-09-15 DIAGNOSIS — Z0000 Encounter for general adult medical examination without abnormal findings: Secondary | ICD-10-CM | POA: Diagnosis not present

## 2017-09-15 DIAGNOSIS — Z125 Encounter for screening for malignant neoplasm of prostate: Secondary | ICD-10-CM | POA: Diagnosis not present

## 2017-09-15 DIAGNOSIS — Z1211 Encounter for screening for malignant neoplasm of colon: Secondary | ICD-10-CM | POA: Diagnosis not present

## 2017-09-24 DIAGNOSIS — D689 Coagulation defect, unspecified: Secondary | ICD-10-CM | POA: Diagnosis not present

## 2017-09-24 DIAGNOSIS — Z7901 Long term (current) use of anticoagulants: Secondary | ICD-10-CM | POA: Diagnosis not present

## 2017-09-24 DIAGNOSIS — Z862 Personal history of diseases of the blood and blood-forming organs and certain disorders involving the immune mechanism: Secondary | ICD-10-CM | POA: Diagnosis not present

## 2017-10-15 DIAGNOSIS — Z7901 Long term (current) use of anticoagulants: Secondary | ICD-10-CM | POA: Diagnosis not present

## 2017-10-27 DIAGNOSIS — Z7901 Long term (current) use of anticoagulants: Secondary | ICD-10-CM | POA: Diagnosis not present

## 2017-10-30 DIAGNOSIS — Z1211 Encounter for screening for malignant neoplasm of colon: Secondary | ICD-10-CM | POA: Diagnosis not present

## 2017-10-30 DIAGNOSIS — Z7901 Long term (current) use of anticoagulants: Secondary | ICD-10-CM | POA: Diagnosis not present

## 2017-11-24 DIAGNOSIS — G4733 Obstructive sleep apnea (adult) (pediatric): Secondary | ICD-10-CM | POA: Diagnosis not present

## 2017-11-24 DIAGNOSIS — I10 Essential (primary) hypertension: Secondary | ICD-10-CM | POA: Diagnosis not present

## 2017-11-25 DIAGNOSIS — Z7901 Long term (current) use of anticoagulants: Secondary | ICD-10-CM | POA: Diagnosis not present

## 2017-12-23 DIAGNOSIS — Z7901 Long term (current) use of anticoagulants: Secondary | ICD-10-CM | POA: Diagnosis not present

## 2017-12-26 ENCOUNTER — Encounter: Payer: Self-pay | Admitting: *Deleted

## 2017-12-29 ENCOUNTER — Ambulatory Visit: Payer: 59 | Admitting: Anesthesiology

## 2017-12-29 ENCOUNTER — Encounter
Admission: RE | Disposition: A | Discharge status: Home / Self Care | Payer: Self-pay | Source: Ambulatory Visit | Attending: Gastroenterology

## 2017-12-29 ENCOUNTER — Ambulatory Visit
Admission: RE | Admit: 2017-12-29 | Discharge: 2017-12-29 | Disposition: A | Discharge status: Home / Self Care | Payer: 59 | Source: Ambulatory Visit | Attending: Gastroenterology | Admitting: Gastroenterology

## 2017-12-29 DIAGNOSIS — Z8371 Family history of colonic polyps: Secondary | ICD-10-CM | POA: Diagnosis present

## 2017-12-29 DIAGNOSIS — K635 Polyp of colon: Secondary | ICD-10-CM | POA: Diagnosis not present

## 2017-12-29 DIAGNOSIS — Z6841 Body Mass Index (BMI) 40.0 and over, adult: Secondary | ICD-10-CM | POA: Diagnosis not present

## 2017-12-29 DIAGNOSIS — K5730 Diverticulosis of large intestine without perforation or abscess without bleeding: Secondary | ICD-10-CM | POA: Insufficient documentation

## 2017-12-29 DIAGNOSIS — Z86718 Personal history of other venous thrombosis and embolism: Secondary | ICD-10-CM | POA: Insufficient documentation

## 2017-12-29 DIAGNOSIS — Z87891 Personal history of nicotine dependence: Secondary | ICD-10-CM | POA: Insufficient documentation

## 2017-12-29 DIAGNOSIS — Z7901 Long term (current) use of anticoagulants: Secondary | ICD-10-CM | POA: Diagnosis not present

## 2017-12-29 DIAGNOSIS — Z86711 Personal history of pulmonary embolism: Secondary | ICD-10-CM | POA: Diagnosis not present

## 2017-12-29 DIAGNOSIS — D125 Benign neoplasm of sigmoid colon: Secondary | ICD-10-CM | POA: Insufficient documentation

## 2017-12-29 DIAGNOSIS — G4730 Sleep apnea, unspecified: Secondary | ICD-10-CM | POA: Insufficient documentation

## 2017-12-29 DIAGNOSIS — Z1211 Encounter for screening for malignant neoplasm of colon: Secondary | ICD-10-CM | POA: Diagnosis not present

## 2017-12-29 DIAGNOSIS — K5790 Diverticulosis of intestine, part unspecified, without perforation or abscess without bleeding: Secondary | ICD-10-CM | POA: Diagnosis not present

## 2017-12-29 HISTORY — DX: Sleep apnea, unspecified: G47.30

## 2017-12-29 HISTORY — PX: COLONOSCOPY WITH PROPOFOL: SHX5780

## 2017-12-29 HISTORY — DX: Coagulation defect, unspecified: D68.9

## 2017-12-29 LAB — PROTIME-INR
INR: 1.13
PROTHROMBIN TIME: 14.4 s (ref 11.4–15.2)

## 2017-12-29 LAB — GLUCOSE, CAPILLARY: Glucose-Capillary: 120 mg/dL — ABNORMAL HIGH (ref 70–99)

## 2017-12-29 SURGERY — COLONOSCOPY WITH PROPOFOL
Anesthesia: General

## 2017-12-29 MED ORDER — PROPOFOL 10 MG/ML IV BOLUS
INTRAVENOUS | Status: DC | PRN
  Administered 2017-12-29: 100 mg via INTRAVENOUS

## 2017-12-29 MED ORDER — PROPOFOL 500 MG/50ML IV EMUL
INTRAVENOUS | Status: DC | PRN
  Administered 2017-12-29: 150 ug/kg/min via INTRAVENOUS

## 2017-12-29 MED ORDER — GLYCOPYRROLATE 0.2 MG/ML IJ SOLN
INTRAMUSCULAR | Status: DC | PRN
  Administered 2017-12-29: 0.2 mg via INTRAVENOUS

## 2017-12-29 MED ORDER — SODIUM CHLORIDE 0.9 % IV SOLN
INTRAVENOUS | Status: DC
  Administered 2017-12-29: 1000 mL via INTRAVENOUS

## 2017-12-29 NOTE — Anesthesia Preprocedure Evaluation (Addendum)
Anesthesia Evaluation  Patient identified by MRN, date of birth, ID band Patient awake    Reviewed: Allergy & Precautions, H&P , NPO status   Airway Mallampati: I  TM Distance: >3 FB Neck ROM: full    Dental  (+) Teeth Intact   Pulmonary sleep apnea , former smoker, PE (anticoagulated)          Cardiovascular negative cardio ROS   Rhythm:regular Rate:Normal     Neuro/Psych negative neurological ROS  negative psych ROS   GI/Hepatic negative GI ROS, Neg liver ROS,   Endo/Other  Morbid obesity  Renal/GU negative Renal ROS  negative genitourinary   Musculoskeletal   Abdominal   Peds  Hematology negative hematology ROS (+)   Anesthesia Other Findings Past Medical History: No date: Clotting disorder (HCC) No date: Glaucoma No date: History of DVT in adulthood No date: History of pulmonary embolus (PE) No date: Sleep apnea  Past Surgical History: 02/28/2014: EYE SURGERY; Right No date: FRACTURE SURGERY 2008: IVC FILTER INSERTION No date: MANDIBLE FRACTURE SURGERY 02/27/2014: pr insert anter drainage dev w/o extraoc reservoir; Right 82/7078: UMBILICAL GRANULOMA EXCISION     Reproductive/Obstetrics negative OB ROS                          Anesthesia Physical Anesthesia Plan  ASA: III  Anesthesia Plan: General   Post-op Pain Management:    Induction:   PONV Risk Score and Plan: TIVA  Airway Management Planned:   Additional Equipment:   Intra-op Plan:   Post-operative Plan:   Informed Consent: I have reviewed the patients History and Physical, chart, labs and discussed the procedure including the risks, benefits and alternatives for the proposed anesthesia with the patient or authorized representative who has indicated his/her understanding and acceptance.   Dental Advisory Given  Plan Discussed with: Anesthesiologist, CRNA and Surgeon  Anesthesia Plan Comments:         Anesthesia Quick Evaluation

## 2017-12-29 NOTE — Transfer of Care (Signed)
Immediate Anesthesia Transfer of Care Note  Patient: Bruce Jenkins.  Procedure(s) Performed: COLONOSCOPY WITH PROPOFOL (N/A )  Patient Location: PACU  Anesthesia Type:General  Level of Consciousness: sedated  Airway & Oxygen Therapy: Patient Spontanous Breathing and Patient connected to nasal cannula oxygen  Post-op Assessment: Report given to RN and Post -op Vital signs reviewed and stable  Post vital signs: Reviewed and stable  Last Vitals:  Vitals Value Taken Time  BP 92/66 12/29/2017 10:18 AM  Temp 36.1 C 12/29/2017 10:16 AM  Pulse 86 12/29/2017 10:18 AM  Resp 15 12/29/2017 10:18 AM  SpO2 94 % 12/29/2017 10:18 AM  Vitals shown include unvalidated device data.  Last Pain:  Vitals:   12/29/17 1016  TempSrc: Tympanic  PainSc: Asleep      Patients Stated Pain Goal: 0 (58/30/74 6002)  Complications: No apparent anesthesia complications

## 2017-12-29 NOTE — H&P (Signed)
Outpatient short stay form Pre-procedure 12/29/2017 8:41 AM Lollie Sails MD  Primary Physician: Dr. Juluis Pitch  Reason for visit: Colonoscopy  History of present illness: Patient is a 52 year old male presenting today as above.  He has a personal family history of colon polyps.  This is his first colonoscopy.  He tolerated his prep well.  He does have some problems at times with a little bit of constipation.  Talked about taking a single dose of Citrucel daily.  Takes no aspirin products.  He does take Coumadin.  His INR this morning was 1.13.  He was on a Coumadin/Lovenox bridge.  He has not taken the Lovenox since yesterday morning.    Current Facility-Administered Medications:  .  0.9 %  sodium chloride infusion, , Intravenous, Continuous, Lollie Sails, MD  Medications Prior to Admission  Medication Sig Dispense Refill Last Dose  . atorvastatin (LIPITOR) 40 MG tablet Take 40 mg by mouth daily.   12/28/2017 at Unknown time  . chlorthalidone (HYGROTON) 25 MG tablet Take 25 mg by mouth daily.   12/28/2017 at Unknown time  . escitalopram (LEXAPRO) 10 MG tablet Take 10 mg by mouth daily.   12/28/2017 at Unknown time  . latanoprost (XALATAN) 0.005 % ophthalmic solution Place 1 drop into the right eye at bedtime.   12/28/2017 at Unknown time  . potassium chloride SA (K-DUR,KLOR-CON) 20 MEQ tablet Take 20 mEq by mouth daily.   12/28/2017 at Unknown time  . warfarin (COUMADIN) 7.5 MG tablet Take 7.5 mg by mouth daily. TAKE 1.5 tablet every Sunday and Thursday   Past Week at Unknown time     Not on File   Past Medical History:  Diagnosis Date  . Clotting disorder ([place])   . Glaucoma   . History of DVT in adulthood   . History of pulmonary embolus (PE)   . Sleep apnea     Review of systems:      Physical Exam    Heart and lungs: Good rate and rhythm without rub or gallop, lungs are bilaterally clear.    HEENT: Normocephalic atraumatic eyes are anicteric    Other:   Pertinant exam for procedure: Soft nontender nondistended bowel sounds positive normoactive    Planned proceedures: Colonoscopy and indicated procedures. I have discussed the risks benefits and complications of procedures to include not limited to bleeding, infection, perforation and the risk of sedation and the patient wishes to proceed.    Lollie Sails, MD Gastroenterology 12/29/2017  8:41 AM

## 2017-12-29 NOTE — Anesthesia Procedure Notes (Signed)
Date/Time: 12/29/2017 10:00 AM Performed by: Nelda Marseille, CRNA Pre-anesthesia Checklist: Patient identified, Emergency Drugs available, Suction available, Patient being monitored and Timeout performed Oxygen Delivery Method: Nasal cannula

## 2017-12-29 NOTE — Op Note (Addendum)
[facility] Patient Name: Bruce Jenkins Procedure Date: 12/29/2017 9:47 AM MRN: [id number] Account #: [id number] Date of Birth: 07-Nov-1965 Admit Type: Outpatient Age: 52 Room: [facility] ENDO ROOM 1 Gender: Male Note Status: Finalized Procedure:            Colonoscopy Indications:          Family history of colonic polyps in a first-degree                        relative Providers:            Lollie Sails, MD Referring MD:         Youlanda Roys. Lovie Macadamia, MD (Referring MD) Medicines:            Monitored Anesthesia Care Complications:        No immediate complications. Procedure:            Pre-Anesthesia Assessment:                       - ASA Grade Assessment: III - A patient with severe                        systemic disease.                       After obtaining informed consent, the colonoscope was                        passed under direct vision. Throughout the procedure,                        the patient's blood pressure, pulse, and oxygen                        saturations were monitored continuously. The                        Colonoscope was introduced through the anus and                        advanced to the the cecum, identified by appendiceal                        orifice and ileocecal valve. The colonoscopy was                        performed without difficulty. The patient tolerated the                        procedure well. The quality of the bowel preparation                        was good. Findings:      A few small-mouthed diverticula were found in the sigmoid colon.      A 5 mm polyp was found in the mid sigmoid colon. The polyp was sessile.       The polyp was removed with a cold snare and cold forcep. Resection and       retrieval were complete.      A 2 mm polyp was found in the mid sigmoid colon. The polyp was sessile.  The polyp was removed with a cold biopsy forceps. Resection and       retrieval were complete.      The retroflexed view of the distal rectum and anal verge was normal and       showed no anal or rectal abnormalities.      The digital rectal exam was normal. Impression:           - Diverticulosis in the sigmoid colon.                       - One 5 mm polyp in the mid sigmoid colon, removed with                        a cold snare. Resected and retrieved.                       - One 2 mm polyp in the mid sigmoid colon, removed with                        a cold biopsy forceps. Resected and retrieved.                       - The distal rectum and anal verge are normal on                        retroflexion view. Recommendation:       - Discharge patient to home.                       - Use Citrucel one tablespoon PO daily daily.                       - Telephone GI clinic for pathology results in 1 week. Procedure Code(s):    --- Professional ---                       [phone], Colonoscopy, flexible; with removal of tumor(s),                        polyp(s), or other lesion(s) by snare technique                       [id number], 38, Colonoscopy, flexible; with biopsy, single                        or multiple Diagnosis Code(s):    --- Professional ---                       D12.5, Benign neoplasm of sigmoid colon                       Z83.71, Family history of colonic polyps                       K57.30, Diverticulosis of large intestine without                        perforation or abscess without bleeding CPT copyright 2017 American Medical Association. All rights reserved. The codes documented in this report are preliminary and upon coder review may  be revised to meet current compliance requirements. Lollie Sails, MD 12/29/2017 10:17:34 AM This report has been signed electronically. Number of Addenda: 0 Note Initiated On: 12/29/2017 9:47 AM Scope Withdrawal Time: 0 hours 11 minutes 22 seconds  Total Procedure Duration: 0 hours 15 minutes 35 seconds       [facility]

## 2017-12-29 NOTE — Anesthesia Post-op Follow-up Note (Signed)
Anesthesia QCDR form completed.        

## 2017-12-29 NOTE — Anesthesia Postprocedure Evaluation (Signed)
Anesthesia Post Note  Patient: Bruce Jenkins.  Procedure(s) Performed: COLONOSCOPY WITH PROPOFOL (N/A )  Patient location during evaluation: PACU Anesthesia Type: General Level of consciousness: awake and alert Pain management: pain level controlled Vital Signs Assessment: post-procedure vital signs reviewed and stable Respiratory status: spontaneous breathing, nonlabored ventilation and respiratory function stable Cardiovascular status: blood pressure returned to baseline and stable Postop Assessment: no apparent nausea or vomiting Anesthetic complications: no     Last Vitals:  Vitals:   12/29/17 1026 12/29/17 1036  BP: 112/73 121/72  Pulse:  62  Resp:  16  Temp:    SpO2:  97%    Last Pain:  Vitals:   12/29/17 1036  TempSrc:   PainSc: 0-No pain                 Durenda Hurt

## 2017-12-31 ENCOUNTER — Encounter: Payer: Self-pay | Admitting: Gastroenterology

## 2017-12-31 LAB — SURGICAL PATHOLOGY

## 2018-01-23 DIAGNOSIS — H401132 Primary open-angle glaucoma, bilateral, moderate stage: Secondary | ICD-10-CM | POA: Diagnosis not present

## 2018-03-02 DIAGNOSIS — I10 Essential (primary) hypertension: Secondary | ICD-10-CM | POA: Diagnosis not present

## 2018-03-02 DIAGNOSIS — E119 Type 2 diabetes mellitus without complications: Secondary | ICD-10-CM | POA: Diagnosis not present

## 2018-03-02 DIAGNOSIS — H401132 Primary open-angle glaucoma, bilateral, moderate stage: Secondary | ICD-10-CM | POA: Diagnosis not present

## 2018-03-18 DIAGNOSIS — D689 Coagulation defect, unspecified: Secondary | ICD-10-CM | POA: Diagnosis not present

## 2018-03-18 DIAGNOSIS — E119 Type 2 diabetes mellitus without complications: Secondary | ICD-10-CM | POA: Diagnosis not present

## 2018-03-18 DIAGNOSIS — I10 Essential (primary) hypertension: Secondary | ICD-10-CM | POA: Diagnosis not present

## 2018-03-18 DIAGNOSIS — E785 Hyperlipidemia, unspecified: Secondary | ICD-10-CM | POA: Insufficient documentation

## 2018-03-18 DIAGNOSIS — F419 Anxiety disorder, unspecified: Secondary | ICD-10-CM | POA: Insufficient documentation

## 2018-04-20 DIAGNOSIS — Z7901 Long term (current) use of anticoagulants: Secondary | ICD-10-CM | POA: Diagnosis not present

## 2018-05-18 DIAGNOSIS — Z7901 Long term (current) use of anticoagulants: Secondary | ICD-10-CM | POA: Diagnosis not present

## 2018-06-16 DIAGNOSIS — Z7901 Long term (current) use of anticoagulants: Secondary | ICD-10-CM | POA: Diagnosis not present

## 2018-07-14 DIAGNOSIS — Z7901 Long term (current) use of anticoagulants: Secondary | ICD-10-CM | POA: Diagnosis not present

## 2018-08-11 DIAGNOSIS — D689 Coagulation defect, unspecified: Secondary | ICD-10-CM | POA: Diagnosis not present

## 2018-08-11 DIAGNOSIS — Z7901 Long term (current) use of anticoagulants: Secondary | ICD-10-CM | POA: Diagnosis not present

## 2018-10-02 DIAGNOSIS — H401132 Primary open-angle glaucoma, bilateral, moderate stage: Secondary | ICD-10-CM | POA: Diagnosis not present

## 2019-08-16 ENCOUNTER — Ambulatory Visit: Payer: Self-pay | Attending: Internal Medicine

## 2019-08-16 DIAGNOSIS — Z23 Encounter for immunization: Secondary | ICD-10-CM

## 2019-08-16 NOTE — Progress Notes (Signed)
   [id number] Vaccination Clinic  Name:  Bruce Jenkins.    MRN: [id number] DOB: 14-Jan-1966  08/16/2019  Bruce Jenkins was observed post Covid-19 immunization for 15 minutes without incident. He was provided with Vaccine Information Sheet and instruction to access the V-Safe system.   Bruce Jenkins was instructed to call 911 with any severe reactions post vaccine: Marland Kitchen Difficulty breathing  . Swelling of face and throat  . A fast heartbeat  . A bad rash all over body  . Dizziness and weakness   Immunizations Administered    Name Date Dose VIS Date Route   Pfizer COVID-19 Vaccine 08/16/2019  1:28 PM 0.3 mL 04/23/2019 Intramuscular   Manufacturer: [place]   Lot: [id number]   [place]: [id number]

## 2019-09-14 ENCOUNTER — Ambulatory Visit: Payer: Self-pay | Attending: Internal Medicine

## 2019-09-14 DIAGNOSIS — Z23 Encounter for immunization: Secondary | ICD-10-CM

## 2019-09-14 NOTE — Progress Notes (Signed)
   [id number] Vaccination Clinic  Name:  Bruce Jenkins.    MRN: [id number] DOB: 1966-03-29  09/14/2019  Bruce Jenkins was observed post Covid-19 immunization for 15 minutes without incident. He was provided with Vaccine Information Sheet and instruction to access the V-Safe system.   Bruce Jenkins was instructed to call 911 with any severe reactions post vaccine: Marland Kitchen Difficulty breathing  . Swelling of face and throat  . A fast heartbeat  . A bad rash all over body  . Dizziness and weakness   Immunizations Administered    Name Date Dose VIS Date Route   Pfizer COVID-19 Vaccine 09/14/2019  8:16 AM 0.3 mL 07/07/2018 Intramuscular   Manufacturer: [employer]   Lot: [id number]   [place]: [id number]

## 2020-04-16 ENCOUNTER — Other Ambulatory Visit: Payer: Self-pay

## 2020-04-16 ENCOUNTER — Encounter: Payer: Self-pay | Admitting: Emergency Medicine

## 2020-04-16 ENCOUNTER — Emergency Department: Payer: BC Managed Care – PPO

## 2020-04-16 ENCOUNTER — Inpatient Hospital Stay
Admission: EM | Admit: 2020-04-16 | Discharge: 2020-04-20 | DRG: 392 | Disposition: A | Discharge status: Home / Self Care | Payer: BC Managed Care – PPO | Attending: General Surgery | Admitting: General Surgery

## 2020-04-16 DIAGNOSIS — K5720 Diverticulitis of large intestine with perforation and abscess without bleeding: Principal | ICD-10-CM | POA: Diagnosis present

## 2020-04-16 DIAGNOSIS — Z86718 Personal history of other venous thrombosis and embolism: Secondary | ICD-10-CM | POA: Diagnosis not present

## 2020-04-16 DIAGNOSIS — H409 Unspecified glaucoma: Secondary | ICD-10-CM | POA: Diagnosis present

## 2020-04-16 DIAGNOSIS — Z87891 Personal history of nicotine dependence: Secondary | ICD-10-CM | POA: Diagnosis not present

## 2020-04-16 DIAGNOSIS — K429 Umbilical hernia without obstruction or gangrene: Secondary | ICD-10-CM | POA: Diagnosis present

## 2020-04-16 DIAGNOSIS — Z86711 Personal history of pulmonary embolism: Secondary | ICD-10-CM | POA: Diagnosis not present

## 2020-04-16 DIAGNOSIS — G4730 Sleep apnea, unspecified: Secondary | ICD-10-CM | POA: Diagnosis present

## 2020-04-16 DIAGNOSIS — Z20822 Contact with and (suspected) exposure to covid-19: Secondary | ICD-10-CM | POA: Diagnosis present

## 2020-04-16 DIAGNOSIS — E876 Hypokalemia: Secondary | ICD-10-CM | POA: Diagnosis not present

## 2020-04-16 DIAGNOSIS — K5780 Diverticulitis of intestine, part unspecified, with perforation and abscess without bleeding: Secondary | ICD-10-CM

## 2020-04-16 DIAGNOSIS — D689 Coagulation defect, unspecified: Secondary | ICD-10-CM | POA: Diagnosis present

## 2020-04-16 DIAGNOSIS — K5792 Diverticulitis of intestine, part unspecified, without perforation or abscess without bleeding: Secondary | ICD-10-CM | POA: Diagnosis not present

## 2020-04-16 DIAGNOSIS — G4733 Obstructive sleep apnea (adult) (pediatric): Secondary | ICD-10-CM | POA: Diagnosis present

## 2020-04-16 DIAGNOSIS — Z7901 Long term (current) use of anticoagulants: Secondary | ICD-10-CM

## 2020-04-16 DIAGNOSIS — Z6841 Body Mass Index (BMI) 40.0 and over, adult: Secondary | ICD-10-CM | POA: Diagnosis not present

## 2020-04-16 LAB — URINALYSIS, COMPLETE (UACMP) WITH MICROSCOPIC
Bacteria, UA: NONE SEEN
Bilirubin Urine: NEGATIVE
Glucose, UA: NEGATIVE mg/dL
Hgb urine dipstick: NEGATIVE
Ketones, ur: NEGATIVE mg/dL
Leukocytes,Ua: NEGATIVE
Nitrite: NEGATIVE
Protein, ur: NEGATIVE mg/dL
Specific Gravity, Urine: 1.013 (ref 1.005–1.030)
Squamous Epithelial / HPF: NONE SEEN (ref 0–5)
pH: 5 (ref 5.0–8.0)

## 2020-04-16 LAB — COMPREHENSIVE METABOLIC PANEL
ALT: 24 U/L (ref 0–44)
AST: 24 U/L (ref 15–41)
Albumin: 3.8 g/dL (ref 3.5–5.0)
Alkaline Phosphatase: 59 U/L (ref 38–126)
Anion gap: 10 (ref 5–15)
BUN: 12 mg/dL (ref 6–20)
CO2: 28 mmol/L (ref 22–32)
Calcium: 9.1 mg/dL (ref 8.9–10.3)
Chloride: 103 mmol/L (ref 98–111)
Creatinine, Ser: 1.18 mg/dL (ref 0.61–1.24)
GFR, Estimated: >60 mL/min (ref >60)
Glucose, Bld: 133 mg/dL — ABNORMAL HIGH (ref 70–99)
Potassium: 3.4 mmol/L — ABNORMAL LOW (ref 3.5–5.1)
Sodium: 141 mmol/L (ref 135–145)
Total Bilirubin: 2.8 mg/dL — ABNORMAL HIGH (ref 0.3–1.2)
Total Protein: 7 g/dL (ref 6.5–8.1)

## 2020-04-16 LAB — LIPASE, BLOOD: Lipase: 38 U/L (ref 11–51)

## 2020-04-16 LAB — RESP PANEL BY RT-PCR (FLU A&B, COVID) ARPGX2
Influenza A by PCR: NEGATIVE
Influenza B by PCR: NEGATIVE
SARS Coronavirus 2 by RT PCR: NEGATIVE

## 2020-04-16 LAB — CBC
HCT: 41 % (ref 39.0–52.0)
Hemoglobin: 13.9 g/dL (ref 13.0–17.0)
MCH: 27.8 pg (ref 26.0–34.0)
MCHC: 33.9 g/dL (ref 30.0–36.0)
MCV: 82 fL (ref 80.0–100.0)
Platelets: 203 10*3/uL (ref 150–400)
RBC: 5 MIL/uL (ref 4.22–5.81)
RDW: 15.1 % (ref 11.5–15.5)
WBC: 10.5 10*3/uL (ref 4.0–10.5)
nRBC: 0 % (ref 0.0–0.2)

## 2020-04-16 LAB — PROTIME-INR
INR: 2.3 — ABNORMAL HIGH (ref 0.8–1.2)
Prothrombin Time: 24.8 seconds — ABNORMAL HIGH (ref 11.4–15.2)

## 2020-04-16 MED ORDER — HYDROMORPHONE HCL 1 MG/ML IJ SOLN
0.5000 mg | INTRAMUSCULAR | Status: DC | PRN
  Administered 2020-04-16: 0.5 mg via INTRAVENOUS
  Filled 2020-04-16: qty 0.5

## 2020-04-16 MED ORDER — PIPERACILLIN-TAZOBACTAM 3.375 G IVPB 30 MIN
3.3750 g | Freq: Once | INTRAVENOUS | Status: AC
  Administered 2020-04-16: 3.375 g via INTRAVENOUS
  Filled 2020-04-16: qty 50

## 2020-04-16 MED ORDER — ACETAMINOPHEN 500 MG PO TABS
1000.0000 mg | ORAL_TABLET | Freq: Once | ORAL | Status: AC
  Administered 2020-04-16: 1000 mg via ORAL
  Filled 2020-04-16: qty 2

## 2020-04-16 MED ORDER — LATANOPROST 0.005 % OP SOLN
1.0000 [drp] | Freq: Every day | OPHTHALMIC | Status: DC
  Administered 2020-04-16 – 2020-04-19 (×4): 1 [drp] via OPHTHALMIC
  Filled 2020-04-16: qty 2.5

## 2020-04-16 MED ORDER — KETOROLAC TROMETHAMINE 15 MG/ML IJ SOLN
15.0000 mg | Freq: Four times a day (QID) | INTRAMUSCULAR | Status: DC | PRN
  Filled 2020-04-16: qty 1

## 2020-04-16 MED ORDER — NETARSUDIL DIMESYLATE 0.02 % OP SOLN: 1.0000 [drp] | Freq: Every evening | OPHTHALMIC | Status: DC

## 2020-04-16 MED ORDER — WARFARIN SODIUM 7.5 MG PO TABS: 7.5000 mg | ORAL_TABLET | ORAL | Status: DC

## 2020-04-16 MED ORDER — ONDANSETRON 4 MG PO TBDP: 4.0000 mg | ORAL_TABLET | Freq: Four times a day (QID) | ORAL | Status: DC | PRN

## 2020-04-16 MED ORDER — LACTATED RINGERS IV BOLUS
1000.0000 mL | Freq: Once | INTRAVENOUS | Status: AC
  Administered 2020-04-16: 1000 mL via INTRAVENOUS

## 2020-04-16 MED ORDER — IOHEXOL 300 MG/ML  SOLN
125.0000 mL | Freq: Once | INTRAMUSCULAR | Status: AC | PRN
  Administered 2020-04-16: 125 mL via INTRAVENOUS

## 2020-04-16 MED ORDER — ACETAMINOPHEN 500 MG PO TABS
1000.0000 mg | ORAL_TABLET | Freq: Four times a day (QID) | ORAL | Status: DC
  Administered 2020-04-16 – 2020-04-20 (×12): 1000 mg via ORAL
  Filled 2020-04-16 (×13): qty 2

## 2020-04-16 MED ORDER — ESCITALOPRAM OXALATE 10 MG PO TABS
20.0000 mg | ORAL_TABLET | Freq: Every day | ORAL | Status: DC
  Administered 2020-04-17 – 2020-04-20 (×4): 20 mg via ORAL
  Filled 2020-04-16 (×4): qty 2

## 2020-04-16 MED ORDER — SIMETHICONE 80 MG PO CHEW
40.0000 mg | CHEWABLE_TABLET | Freq: Four times a day (QID) | ORAL | Status: DC | PRN
  Administered 2020-04-16: 40 mg via ORAL
  Filled 2020-04-16 (×2): qty 1

## 2020-04-16 MED ORDER — DOXEPIN HCL 10 MG PO CAPS
10.0000 mg | ORAL_CAPSULE | Freq: Every day | ORAL | Status: DC
  Administered 2020-04-16 – 2020-04-19 (×4): 10 mg via ORAL
  Filled 2020-04-16 (×5): qty 1

## 2020-04-16 MED ORDER — PIPERACILLIN-TAZOBACTAM 3.375 G IVPB
3.3750 g | Freq: Three times a day (TID) | INTRAVENOUS | Status: DC
  Administered 2020-04-16 – 2020-04-20 (×11): 3.375 g via INTRAVENOUS
  Filled 2020-04-16 (×11): qty 50

## 2020-04-16 MED ORDER — ONDANSETRON HCL 4 MG/2ML IJ SOLN: 4.0000 mg | Freq: Four times a day (QID) | INTRAMUSCULAR | Status: DC | PRN

## 2020-04-16 MED ORDER — IBUPROFEN 400 MG PO TABS: 600.0000 mg | ORAL_TABLET | Freq: Four times a day (QID) | ORAL | Status: DC | PRN

## 2020-04-16 MED ORDER — HYDROMORPHONE HCL 1 MG/ML IJ SOLN
0.5000 mg | Freq: Once | INTRAMUSCULAR | Status: AC
  Administered 2020-04-16: 0.5 mg via INTRAVENOUS
  Filled 2020-04-16: qty 1

## 2020-04-16 MED ORDER — KCL IN DEXTROSE-NACL 20-5-0.9 MEQ/L-%-% IV SOLN
INTRAVENOUS | Status: DC
  Filled 2020-04-16 (×14): qty 1000

## 2020-04-16 NOTE — ED Notes (Signed)
Report given to John, RN

## 2020-04-16 NOTE — ED Triage Notes (Signed)
Pt reports pain to his left lower abd for the past couple of days. Pt reports has had the same issue twice in the past month for the same.

## 2020-04-16 NOTE — Consult Note (Signed)
[place] for Warfarin Indication: Hx DVT/PE  Not on File  Patient Measurements: Height: 6' (182.9 cm) Weight: (!) 140.6 kg (310 lb) IBW/kg (Calculated) : 77.6  Vital Signs: Temp: 98.4 F (36.9 C) (12/05 1453) Temp Source: Oral (12/05 1453) BP: 115/76 (12/05 1453) Pulse Rate: 87 (12/05 1453)  Labs: Recent Labs    04/16/20 1022 04/16/20 1244  HGB 13.9  --   HCT 41.0  --   PLT 203  --   LABPROT  --  24.8*  INR  --  2.3*  CREATININE 1.18  --     Estimated Creatinine Clearance: 106.5 mL/min (by C-G formula based on SCr of 1.18 mg/dL).   Medical History: Past Medical History:  Diagnosis Date  . Clotting disorder ([place])   . Glaucoma   . History of DVT in adulthood   . History of pulmonary embolus (PE)   . Sleep apnea     Medications:  Warfarin 11.25mg  every M/Tu/W/Thurs/Fri Warfarin 7.5mg  every Sat/Sun Last dose was 7.5mg  on 12/5  Assessment: 54yo male with a past medical history of DVT and PE currently on Coumadin as well as OSA who presents to the ED c/o 2 days of LLQ abdominal pain associated with subjective fevers. Pharmacy has been consulted for warfarin dosing and monitoring for hx of DVT/PE. Baseline labs WNL.    Date INR Dose 12/5 2.3 7.5mg  - pt reported   Goal of Therapy:  INR 2-3 Monitor platelets by anticoagulation protocol: Yes   Plan:  INR 2.3 - therapeutic. Patient has already had dose of warfarin today. Will not order another dose for tonight. Monitor INR daily and adjust dose as clinically indicated Monitor CBC per protocol  Sherilyn Banker, PharmD Pharmacy Resident  04/16/2020 4:22 PM

## 2020-04-16 NOTE — ED Provider Notes (Signed)
[facility] Emergency Department Provider Note  ____________________________________________   First MD Initiated Contact with Patient 04/16/20 1236     (approximate)  I have reviewed the triage vital signs and the nursing notes.   HISTORY  Chief Complaint Abdominal Pain   HPI Bruce Jenkins. is a 54 y.o. male with a past medical history of DVT and PE currently on Coumadin as well as OSA who presents for assessment of 2 days of left lower quadrant abdominal pain associate with subjective fevers.  Patient states he has had episodes like this in the past several months ago but this resolved on their own without any intervention or evaluation.  He denies any other acute symptoms including headache, nausea, vomiting, diarrhea, dysuria, back pain, rash, burning with urination, blood in his stool, blood in his urine, cough or other acute complaints.  No clearly feeding aggravating factors.          Past Medical History:  Diagnosis Date  . Clotting disorder ([place])   . Glaucoma   . History of DVT in adulthood   . History of pulmonary embolus (PE)   . Sleep apnea     There are no problems to display for this patient.   Past Surgical History:  Procedure Laterality Date  . COLONOSCOPY WITH PROPOFOL N/A 12/29/2017   Procedure: COLONOSCOPY WITH PROPOFOL;  Surgeon: Lollie Sails, MD;  Location: [facility] ENDOSCOPY;  Service: Endoscopy;  Laterality: N/A;  . EYE SURGERY Right 02/28/2014  . FRACTURE SURGERY    . IVC FILTER INSERTION  2008  . MANDIBLE FRACTURE SURGERY    . pr insert anter drainage dev w/o extraoc reservoir Right 02/27/2014  . UMBILICAL GRANULOMA EXCISION  07/2013    Prior to Admission medications   Medication Sig Start Date End Date Taking? Authorizing Provider  atorvastatin (LIPITOR) 40 MG tablet Take 40 mg by mouth daily.    [provider]  chlorthalidone (HYGROTON) 25 MG tablet Take 25 mg by mouth daily.    [provider]  escitalopram (LEXAPRO) 10 MG tablet Take 10 mg by mouth daily.    [provider]  latanoprost (XALATAN) 0.005 % ophthalmic solution Place 1 drop into the right eye at bedtime.    [provider]  potassium chloride SA (K-DUR,KLOR-CON) 20 MEQ tablet Take 20 mEq by mouth daily.    [provider]  warfarin (COUMADIN) 7.5 MG tablet Take 7.5 mg by mouth daily. TAKE 1.5 tablet every Sunday and Thursday    [provider]    Allergies Patient has no allergy information on record.  No family history on file.  Social History Social History   Tobacco Use  . Smoking status: Former Research scientist (life sciences)  . Smokeless tobacco: Never Used  Vaping Use  . Vaping Use: Never used  Substance Use Topics  . Alcohol use: No  . Drug use: No    Review of Systems  Review of Systems  Constitutional: Positive for fever and malaise/fatigue. Negative for chills.  HENT: Negative for sore throat.   Eyes: Negative for pain.  Respiratory: Negative for cough and stridor.   Cardiovascular: Negative for chest pain.  Gastrointestinal: Positive for abdominal pain. Negative for vomiting.  Genitourinary: Negative for dysuria.  Musculoskeletal: Negative for myalgias.  Skin: Negative for rash.  Neurological: Negative for seizures, loss of consciousness and headaches.  Psychiatric/Behavioral: Negative for suicidal ideas.  All other systems reviewed and are negative.     ____________________________________________   PHYSICAL EXAM:  VITAL SIGNS: ED Triage Vitals  Enc Vitals Group     BP 04/16/20 1019 109/65     Pulse Rate 04/16/20 1019 (!) 114     Resp 04/16/20 1019 20     Temp 04/16/20 1019 100.2 F (37.9 C)     Temp Source 04/16/20 1019 Oral     SpO2 04/16/20 1019 97 %     Weight 04/16/20 1020 (!) 310 lb (140.6 kg)     Height 04/16/20 1020 6' (1.829 m)     Head Circumference --      Peak Flow --      Pain Score 04/16/20 1020 7     Pain Loc --      Pain Edu? --       Excl. in [place]? --    Vitals:   04/16/20 1327 04/16/20 1453  BP: (!) 147/76 115/76  Pulse: 95 87  Resp: 18 18  Temp: 100.3 F (37.9 C) 98.4 F (36.9 C)  SpO2: 97% 97%   Physical Exam Vitals and nursing note reviewed.  Constitutional:      Appearance: He is well-developed. He is obese.  HENT:     Head: Normocephalic and atraumatic.  Eyes:     Conjunctiva/sclera: Conjunctivae normal.  Cardiovascular:     Rate and Rhythm: Regular rhythm. Tachycardia present.     Heart sounds: No murmur heard.   Pulmonary:     Effort: Pulmonary effort is normal. No respiratory distress.     Breath sounds: Normal breath sounds.  Abdominal:     Palpations: Abdomen is soft.     Tenderness: There is abdominal tenderness in the left lower quadrant. There is no right CVA tenderness or left CVA tenderness.  Musculoskeletal:     Cervical back: Neck supple.  Skin:    General: Skin is warm and dry.     Capillary Refill: Capillary refill takes less than 2 seconds.  Neurological:     General: No focal deficit present.     Mental Status: He is alert.  Psychiatric:        Mood and Affect: Mood normal.      ____________________________________________   LABS (all labs ordered are listed, but only abnormal results are displayed)  Labs Reviewed  COMPREHENSIVE METABOLIC PANEL - Abnormal; Notable for the following components:      Result Value   Potassium 3.4 (*)    Glucose, Bld 133 (*)    Total Bilirubin 2.8 (*)    All other components within normal limits  URINALYSIS, COMPLETE (UACMP) WITH MICROSCOPIC - Abnormal; Notable for the following components:   Color, Urine YELLOW (*)    APPearance CLEAR (*)    All other components within normal limits  PROTIME-INR - Abnormal; Notable for the following components:   Prothrombin Time 24.8 (*)    INR 2.3 (*)    All other components within normal limits  CULTURE, BLOOD (ROUTINE X 2)  CULTURE, BLOOD (ROUTINE X 2)  RESP PANEL BY RT-PCR (FLU A&B, COVID)  ARPGX2  LIPASE, BLOOD  CBC   ____________________________________________  ____________________________________________  RADIOLOGY  ED MD interpretation: Contained perforation of the lower descending colon with diverticulitis.  No other acute intra-abdominal process including evidence of pyelonephritis, appendicitis, cholecystitis, pancreatitis or SBO.  Official radiology report(s): CT ABDOMEN PELVIS W CONTRAST  Result Date: 04/16/2020 CLINICAL DATA:  Left lower abdominal pain for the past several days. EXAM: CT ABDOMEN AND PELVIS WITH CONTRAST TECHNIQUE: Multidetector CT imaging of the abdomen and pelvis was  performed using the standard protocol following bolus administration of intravenous contrast. CONTRAST:  157mL OMNIPAQUE IOHEXOL 300 MG/ML  SOLN COMPARISON:  06/29/2013 and 08/11/2012. FINDINGS: Lower chest: No acute abnormality. Hepatobiliary: No focal liver abnormality is seen. No gallstones, gallbladder wall thickening, or biliary dilatation. Pancreas: Unremarkable. No pancreatic ductal dilatation or surrounding inflammatory changes. Spleen: Normal in size without focal abnormality. Adrenals/Urinary Tract: No adrenal masses. Kidneys normal in size, orientation and position with symmetric enhancement. There is no collecting system contrast on the delayed sequence in either kidney. Low-attenuation renal masses consistent with cysts, an 8 mm cyst in the upper pole the left kidney and 2 adjacent cysts in the lower pole the right kidney, the largest 7.1 cm. No other masses, no stones and no hydronephrosis. Ureters normal in course and in caliber. Bladder is unremarkable. Stomach/Bowel: Irregular wall thickening and inflammation extends along the lower descending colon, with there is contained extraluminal air anteriorly, spanning approximately 2.5 cm. This may reflect an early peridiverticular abscess. There are diverticula along this portion of the descending colon as well as the sigmoid colon. No  other areas of colonic inflammation. Normal stomach.  Normal small bowel.  Normal appendix. Vascular/Lymphatic: Aortic atherosclerosis. No aneurysm. No enlarged lymph nodes. Reproductive: Unremarkable. Other: No abdominal wall hernia or abnormality. No abdominopelvic ascites. Musculoskeletal: No fracture or acute finding.  No bone lesion. IMPRESSION: 1. Diverticulitis of the lower descending colon complicated by a localized perforation, reflected by contained air and and surrounding inflammation. This may reflect an early peridiverticular abscess. 2. No free air. 3. No other acute abnormality within the abdomen or pelvis. 4. Aortic atherosclerosis. Electronically Signed   By: Lajean Manes M.D.   On: 04/16/2020 15:01    ____________________________________________   PROCEDURES  Procedure(s) performed (including Critical Care):  Procedures   ____________________________________________   INITIAL IMPRESSION / ASSESSMENT AND PLAN / ED COURSE        Patient presents close to history exam for assessment of 2 days of worsening left lower quadrant abdominal pain associate with subjective fevers.  Patient is afebrile although with an elevated temperature at 100.2 on arrival with a heart rate of 114 otherwise stable vital signs on room air.  He does have some mild tenderness to palpation of left lower quadrant.  Overall patient's history, exam, and ED work-up is most concerning for likely contained perforation secondary to diverticulitis.  No evidence on work-up of acute cholecystitis, pancreatitis, pyelonephritis, torsion, cystitis, SBO, or other immediate life or intra-abdominal process.  Lipase is within normal notes.  CMP shows a K of 3.4 no other significant derangements aside from the T bili of 2.8.  Unclear etiology at this time as patient has no tenderness in the right upper quadrant or other GI symptoms.  CBC is unremarkable.  UA is unremarkable.  INR is therapeutic at 2.3.  Discussed above CT  findings and concern for peripheral diverticulitis with on-call surgeon Dr. Celine Ahr who will admit the patient.  Patient given IV fluids and Zosyn and blood cultures were obtained.  Admitted in stable condition.  ____________________________________________   FINAL CLINICAL IMPRESSION(S) / ED DIAGNOSES  Final diagnoses:  Diverticulitis  Perforated diverticulum    Medications  piperacillin-tazobactam (ZOSYN) IVPB 3.375 g (has no administration in time range)  lactated ringers bolus 1,000 mL (0 mLs Intravenous Stopped 04/16/20 1451)  HYDROmorphone (DILAUDID) injection 0.5 mg (0.5 mg Intravenous Given 04/16/20 1320)  acetaminophen (TYLENOL) tablet 1,000 mg (1,000 mg Oral Given 04/16/20 1319)  iohexol (OMNIPAQUE) 300 MG/ML solution  125 mL (125 mLs Intravenous Contrast Given 04/16/20 1430)     ED Discharge Orders    None       Note:  This document was prepared using Dragon voice recognition software and may include unintentional dictation errors.   Lucrezia Starch, MD 04/16/20 [phone]

## 2020-04-16 NOTE — H&P (Signed)
Reason for Consult:perforated diverticulitis Referring Physician: Hulan Saas, MD (emergency medicine)  Bruce Jenkins. is an 54 y.o. male.  HPI: He has a past medical history of DVT and PE currently on Coumadin as well as OSA who presents for assessment of 2 days of left lower quadrant abdominal pain associate with subjective fevers.  Patient states he has had episodes like this in the past several months ago but this resolved on their own without any intervention or evaluation.  He denies any other acute symptoms including headache, nausea, vomiting, diarrhea, dysuria, back pain, rash, burning with urination, blood in his stool, blood in his urine, cough or other acute complaints.  No clearly feeding aggravating factors.   Evaluation in the emergency department included lab work that did not show a leukocytosis, but did show an increased bilirubin of unclear etiology.  INR is therapeutic at 2.3.  CT scan of the abdomen and pelvis was performed that demonstrated a contained perforation of sigmoid diverticulitis.  General surgery has been consulted for further evaluation and management.  Past Medical History:  Diagnosis Date  . Clotting disorder ([place])   . Glaucoma   . History of DVT in adulthood   . History of pulmonary embolus (PE)   . Sleep apnea     Past Surgical History:  Procedure Laterality Date  . COLONOSCOPY WITH PROPOFOL N/A 12/29/2017   Procedure: COLONOSCOPY WITH PROPOFOL;  Surgeon: Lollie Sails, MD;  Location: [facility] ENDOSCOPY;  Service: Endoscopy;  Laterality: N/A;  . EYE SURGERY Right 02/28/2014  . FRACTURE SURGERY    . IVC FILTER INSERTION  2008  . MANDIBLE FRACTURE SURGERY    . pr insert anter drainage dev w/o extraoc reservoir Right 02/27/2014  . UMBILICAL GRANULOMA EXCISION  07/2013    No family history on file.  Social History:  reports that he has quit smoking. He has never used smokeless tobacco. He reports that he does not drink alcohol and does not use  drugs.  Allergies: Not on File  Medications: I have reviewed the patient's current medications.  Results for orders placed or performed during the hospital encounter of 04/16/20 (from the past 48 hour(s))  Lipase, blood     Status: None   Collection Time: 04/16/20 10:22 AM  Result Value Ref Range   Lipase 38 11 - 51 U/L    Comment: Performed at [facility], [address] [place], [place] [address]  Comprehensive metabolic panel     Status: Abnormal   Collection Time: 04/16/20 10:22 AM  Result Value Ref Range   Sodium 141 135 - 145 mmol/L   Potassium 3.4 (L) 3.5 - 5.1 mmol/L   Chloride 103 98 - 111 mmol/L   CO2 28 22 - 32 mmol/L   Glucose, Bld 133 (H) 70 - 99 mg/dL    Comment: Glucose reference range applies only to samples taken after fasting for at least 8 hours.   BUN 12 6 - 20 mg/dL   Creatinine, Ser 1.18 0.61 - 1.24 mg/dL   Calcium 9.1 8.9 - 10.3 mg/dL   Total Protein 7.0 6.5 - 8.1 g/dL   Albumin 3.8 3.5 - 5.0 g/dL   AST 24 15 - 41 U/L   ALT 24 0 - 44 U/L   Alkaline Phosphatase 59 38 - 126 U/L   Total Bilirubin 2.8 (H) 0.3 - 1.2 mg/dL   GFR, Estimated >60 >60 mL/min    Comment: (NOTE) Calculated using the CKD-EPI Creatinine Equation (2021)  Anion gap 10 5 - 15    Comment: Performed at [facility], [address] [place], [place] [address]  CBC     Status: None   Collection Time: 04/16/20 10:22 AM  Result Value Ref Range   WBC 10.5 4.0 - 10.5 K/uL   RBC 5.00 4.22 - 5.81 MIL/uL   Hemoglobin 13.9 13.0 - 17.0 g/dL   HCT 41.0 39 - 52 %   MCV 82.0 80.0 - 100.0 fL   MCH 27.8 26.0 - 34.0 pg   MCHC 33.9 30.0 - 36.0 g/dL   RDW 15.1 11.5 - 15.5 %   Platelets 203 150 - 400 K/uL   nRBC 0.0 0.0 - 0.2 %    Comment: Performed at [facility], [address] [place], [place] [address]  Urinalysis, Complete w Microscopic     Status: Abnormal   Collection Time: 04/16/20 12:44 PM  Result Value Ref Range   Color, Urine YELLOW (A) YELLOW    APPearance CLEAR (A) CLEAR   Specific Gravity, Urine 1.013 1.005 - 1.030   pH 5.0 5.0 - 8.0   Glucose, UA NEGATIVE NEGATIVE mg/dL   Hgb urine dipstick NEGATIVE NEGATIVE   Bilirubin Urine NEGATIVE NEGATIVE   Ketones, ur NEGATIVE NEGATIVE mg/dL   Protein, ur NEGATIVE NEGATIVE mg/dL   Nitrite NEGATIVE NEGATIVE   Leukocytes,Ua NEGATIVE NEGATIVE   WBC, UA 0-5 0 - 5 WBC/hpf   Bacteria, UA NONE SEEN NONE SEEN   Squamous Epithelial / LPF NONE SEEN 0 - 5   Mucus PRESENT     Comment: Performed at [facility], [address] [place], [place] [address]  Protime-INR     Status: Abnormal   Collection Time: 04/16/20 12:44 PM  Result Value Ref Range   Prothrombin Time 24.8 (H) 11.4 - 15.2 seconds   INR 2.3 (H) 0.8 - 1.2    Comment: (NOTE) INR goal varies based on device and disease states. Performed at [facility], [address] [place], [place] [address]     CT ABDOMEN PELVIS W CONTRAST  Result Date: 04/16/2020 CLINICAL DATA:  Left lower abdominal pain for the past several days. EXAM: CT ABDOMEN AND PELVIS WITH CONTRAST TECHNIQUE: Multidetector CT imaging of the abdomen and pelvis was performed using the standard protocol following bolus administration of intravenous contrast. CONTRAST:  19mL OMNIPAQUE IOHEXOL 300 MG/ML  SOLN COMPARISON:  06/29/2013 and 08/11/2012. FINDINGS: Lower chest: No acute abnormality. Hepatobiliary: No focal liver abnormality is seen. No gallstones, gallbladder wall thickening, or biliary dilatation. Pancreas: Unremarkable. No pancreatic ductal dilatation or surrounding inflammatory changes. Spleen: Normal in size without focal abnormality. Adrenals/Urinary Tract: No adrenal masses. Kidneys normal in size, orientation and position with symmetric enhancement. There is no collecting system contrast on the delayed sequence in either kidney. Low-attenuation renal masses consistent with cysts, an 8 mm cyst in the upper pole the left kidney and 2 adjacent  cysts in the lower pole the right kidney, the largest 7.1 cm. No other masses, no stones and no hydronephrosis. Ureters normal in course and in caliber. Bladder is unremarkable. Stomach/Bowel: Irregular wall thickening and inflammation extends along the lower descending colon, with there is contained extraluminal air anteriorly, spanning approximately 2.5 cm. This may reflect an early peridiverticular abscess. There are diverticula along this portion of the descending colon as well as the sigmoid colon. No other areas of colonic inflammation. Normal stomach.  Normal small bowel.  Normal appendix. Vascular/Lymphatic: Aortic atherosclerosis. No aneurysm. No enlarged  lymph nodes. Reproductive: Unremarkable. Other: No abdominal wall hernia or abnormality. No abdominopelvic ascites. Musculoskeletal: No fracture or acute finding.  No bone lesion. IMPRESSION: 1. Diverticulitis of the lower descending colon complicated by a localized perforation, reflected by contained air and and surrounding inflammation. This may reflect an early peridiverticular abscess. 2. No free air. 3. No other acute abnormality within the abdomen or pelvis. 4. Aortic atherosclerosis. Electronically Signed   By: Lajean Manes M.D.   On: 04/16/2020 15:01    Review of Systems  Constitutional: Positive for fever. Negative for chills.  Cardiovascular: Positive for leg swelling.  Gastrointestinal: Positive for abdominal pain. Negative for constipation, diarrhea, nausea and vomiting.  All other systems reviewed and are negative.  Blood pressure 115/76, pulse 87, temperature 98.4 F (36.9 C), temperature source Oral, resp. rate 18, height 6' (1.829 m), weight (!) 140.6 kg, SpO2 97 %. Physical Exam Constitutional:      General: He is not in acute distress.    Appearance: He is well-developed. He is obese.  HENT:     Head: Normocephalic and atraumatic.  Eyes:     General: No scleral icterus. Cardiovascular:     Rate and Rhythm: Normal rate  and regular rhythm.  Pulmonary:     Effort: Pulmonary effort is normal. No respiratory distress.  Abdominal:     General: Abdomen is protuberant.     Palpations: Abdomen is soft.     Tenderness: There is abdominal tenderness in the left lower quadrant. There is no guarding or rebound. Negative signs include Murphy's sign, Rovsing's sign, McBurney's sign, psoas sign and obturator sign.     Hernia: A hernia is present. Hernia is present in the umbilical area.  Genitourinary:    Comments: deferred Skin:    General: Skin is warm and dry.  Neurological:     General: No focal deficit present.     Mental Status: He is alert.  Psychiatric:        Mood and Affect: Mood normal.        Behavior: Behavior normal.     Assessment/Plan: 54 y/o M with contained perforation of sigmoid diverticulitis. -Admit to surgery -NPO/IVF -Monitor abdominal exam -continue warfarin for now, due to extensive clotting history; may need to reverse if intervention is required. -I discussed the potential need for operative intervention, if he fails to progress with conservative management.  Bruce Jenkins 04/16/2020, 3:58 PM

## 2020-04-17 LAB — PHOSPHORUS: Phosphorus: 3.3 mg/dL (ref 2.5–4.6)

## 2020-04-17 LAB — CBC
HCT: 35.5 % — ABNORMAL LOW (ref 39.0–52.0)
Hemoglobin: 11.7 g/dL — ABNORMAL LOW (ref 13.0–17.0)
MCH: 27.2 pg (ref 26.0–34.0)
MCHC: 33 g/dL (ref 30.0–36.0)
MCV: 82.6 fL (ref 80.0–100.0)
Platelets: 160 10*3/uL (ref 150–400)
RBC: 4.3 MIL/uL (ref 4.22–5.81)
RDW: 15.2 % (ref 11.5–15.5)
WBC: 8 10*3/uL (ref 4.0–10.5)
nRBC: 0 % (ref 0.0–0.2)

## 2020-04-17 LAB — COMPREHENSIVE METABOLIC PANEL
ALT: 19 U/L (ref 0–44)
AST: 17 U/L (ref 15–41)
Albumin: 3.2 g/dL — ABNORMAL LOW (ref 3.5–5.0)
Alkaline Phosphatase: 56 U/L (ref 38–126)
Anion gap: 8 (ref 5–15)
BUN: 11 mg/dL (ref 6–20)
CO2: 30 mmol/L (ref 22–32)
Calcium: 8.2 mg/dL — ABNORMAL LOW (ref 8.9–10.3)
Chloride: 103 mmol/L (ref 98–111)
Creatinine, Ser: 1.25 mg/dL — ABNORMAL HIGH (ref 0.61–1.24)
GFR, Estimated: >60 mL/min (ref >60)
Glucose, Bld: 144 mg/dL — ABNORMAL HIGH (ref 70–99)
Potassium: 3.5 mmol/L (ref 3.5–5.1)
Sodium: 141 mmol/L (ref 135–145)
Total Bilirubin: 3.4 mg/dL — ABNORMAL HIGH (ref 0.3–1.2)
Total Protein: 6.2 g/dL — ABNORMAL LOW (ref 6.5–8.1)

## 2020-04-17 LAB — PROTIME-INR
INR: 2.7 — ABNORMAL HIGH (ref 0.8–1.2)
Prothrombin Time: 27.5 seconds — ABNORMAL HIGH (ref 11.4–15.2)

## 2020-04-17 LAB — HIV ANTIBODY (ROUTINE TESTING W REFLEX): HIV Screen 4th Generation wRfx: NONREACTIVE

## 2020-04-17 LAB — MAGNESIUM: Magnesium: 1.7 mg/dL (ref 1.7–2.4)

## 2020-04-17 MED ORDER — WARFARIN SODIUM 5 MG PO TABS
5.0000 mg | ORAL_TABLET | Freq: Once | ORAL | Status: AC
  Administered 2020-04-17: 5 mg via ORAL
  Filled 2020-04-17: qty 1

## 2020-04-17 MED ORDER — WARFARIN - PHARMACIST DOSING INPATIENT: Freq: Every day | Status: DC

## 2020-04-17 MED ORDER — SODIUM CHLORIDE 0.9 % IV SOLN
INTRAVENOUS | Status: DC | PRN
  Administered 2020-04-17 – 2020-04-18 (×2): 250 mL via INTRAVENOUS

## 2020-04-17 NOTE — Progress Notes (Addendum)
[facility] SURGICAL PROGRESS NOTE (cpt [id number])  Hospital Day(s): 1.   Interval History: Patient seen and examined, no acute events or new complaints overnight. Patient reports he continues to have persistent LLQ pain similar to presentation. This is worse with movement today. He denies fever, chills, nausea, emesis. He remains without leukocytosis and WBC 8.0K this morning. His sCr remains around what appears to be his baseline at 1.25. No significant electrolyte derangements.  He has remained NPO. Continues on Zosyn.   Review of Systems:  Constitutional: denies fever, chills  HEENT: denies cough or congestion  Respiratory: denies any shortness of breath  Cardiovascular: denies chest pain or palpitations  Gastrointestinal: + abdominal pain, denied N/V, or diarrhea/and bowel function as per interval history Genitourinary: denies burning with urination or urinary frequency  Vital signs in last 24 hours: [min-max] current  Temp:  [98.1 F (36.7 C)-100.3 F (37.9 C)] 98.1 F (36.7 C) (12/06 0547) Pulse Rate:  [85-114] 91 (12/06 0547) Resp:  [16-20] 18 (12/06 0547) BP: (105-147)/(57-85) 105/57 (12/06 0547) SpO2:  [97 %-99 %] 99 % (12/06 0547) Weight:  [140.6 kg] 140.6 kg (12/05 1020)     Height: 6' (182.9 cm) Weight: (!) 140.6 kg BMI (Calculated): 42.03   Intake/Output last 2 shifts:  12/05 0701 - 12/06 0700 In: 2771.9 [P.O.:80; I.V.:1591.9; IV Piggyback:1100] Out: 500 [Urine:500]   Physical Exam:  Constitutional: alert, cooperative and no distress  HENT: normocephalic without obvious abnormality  Eyes: PERRL, EOM's grossly intact and symmetric  Respiratory: breathing non-labored at rest  Cardiovascular: regular rate and sinus rhythm  Gastrointestinal: Soft, remains point tender to the LLQ, non-distended, no rebound/guarding Musculoskeletal: No edema or wounds, motor and sensation grossly intact, NT    Labs:  CBC Latest Ref Rng & Units 04/17/2020 04/16/2020  11/06/2016  WBC 4.0 - 10.5 K/uL 8.0 10.5 6.2  Hemoglobin 13.0 - 17.0 g/dL 11.7(L) 13.9 13.9  Hematocrit 39 - 52 % 35.5(L) 41.0 41.1  Platelets 150 - 400 K/uL 160 203 179   CMP Latest Ref Rng & Units 04/17/2020 04/16/2020 11/06/2016  Glucose 70 - 99 mg/dL 144(H) 133(H) 109(H)  BUN 6 - 20 mg/dL 11 12 18   Creatinine 0.61 - 1.24 mg/dL 1.25(H) 1.18 1.28(H)  Sodium 135 - 145 mmol/L 141 141 142  Potassium 3.5 - 5.1 mmol/L 3.5 3.4(L) 3.6  Chloride 98 - 111 mmol/L 103 103 104  CO2 22 - 32 mmol/L 30 28 31   Calcium 8.9 - 10.3 mg/dL 8.2(L) 9.1 9.5  Total Protein 6.5 - 8.1 g/dL 6.2(L) 7.0 -  Total Bilirubin 0.3 - 1.2 mg/dL 3.4(H) 2.8(H) -  Alkaline Phos 38 - 126 U/L 56 59 -  AST 15 - 41 U/L 17 24 -  ALT 0 - 44 U/L 19 24 -    Imaging studies: No new pertinent imaging studies   Assessment/Plan: (ICD-10's: K79.20) 54 y.o. male with persistent LLQ abdominal pain complicated diverticulitis with contained perforation without abscess   - Recommend remain NPO (sips of water okay) given persistent pain  - Continue IVF resuscitation  - Continue IV ABx (Zosyn)   - Monitor abdominal examination  - Pain control prn; antiemetics prn  - May need to consider re-imaging in ~72 hours pending clinical condition   - No need for emergent surgical intervention. He understands that if he were to fail conservative management or clinically deteriorate, then we would need to pursue more emergent intervention and likely temporizing colostomy.   - Monitor leukocytosis  - Monitor renal  function; electrolytes  - Mobilization as feasible  - DVT prophylaxis   All of the above findings and recommendations were discussed with the patient, and the medical team, and all of patient's questions were answered to his expressed satisfaction.  -- Edison Simon, PA-C [facility] 04/17/2020, 7:30 AM [phone] M-F: 7am - 4pm  I saw and evaluated the patient.  I agree with the above documentation, exam, and  plan, which I have edited where appropriate. Fredirick Maudlin  1:56 PM

## 2020-04-17 NOTE — Consult Note (Signed)
ANTICOAGULATION CONSULT NOTE  Pharmacy Consult for Warfarin Indication: Hx DVT/PE  Patient Measurements: Height: 6' (182.9 cm) Weight: (!) 140.6 kg (310 lb) IBW/kg (Calculated) : 77.6  Vital Signs: Temp: 98.1 F (36.7 C) (12/06 0547) Temp Source: Oral (12/06 0547) BP: 105/57 (12/06 0547) Pulse Rate: 91 (12/06 0547)  Labs: Recent Labs    04/16/20 1022 04/16/20 1244 04/17/20 0311  HGB 13.9  --  11.7*  HCT 41.0  --  35.5*  PLT 203  --  160  LABPROT  --  24.8* 27.5*  INR  --  2.3* 2.7*  CREATININE 1.18  --  1.25*    Estimated Creatinine Clearance: 100.5 mL/min (A) (by C-G formula based on SCr of 1.25 mg/dL (H)).   Medical History: Past Medical History:  Diagnosis Date  . Clotting disorder ([place])   . Glaucoma   . History of DVT in adulthood   . History of pulmonary embolus (PE)   . Sleep apnea     Medications:  Warfarin 11.25mg  every M/Tu/W/Thurs/Fri Warfarin 7.5mg  every Sat/Sun Last dose was 7.5mg  on 12/5 Average daily dose approximately 10 mg  Assessment: 54yo male with a past medical history of DVT and PE currently on Coumadin as well as OSA who presents to the ED c/o 2 days of LLQ abdominal pain associated with subjective fevers. Pharmacy has been consulted for warfarin dosing and monitoring for hx of DVT/PE. H&H trending down, platelets WNL  DDIs: Zosyn   Date   INR   Dose 12/5   2.3   7.5mg  - pt reported 12/6   2.7   5 mg   Goal of Therapy:  INR 2-3 Monitor platelets by anticoagulation protocol: Yes   Plan:   INR therapeutic and trending up slightly toward upper end of therapeutic range  warfarin 5 mg po x 1 tonight   This is less than average home dose given trend, DDI and place in INR range  INR daily and adjust dose as clinically indicated  Monitor CBC per protocol  Dallie Piles, PharmD 04/17/2020 7:27 AM

## 2020-04-18 LAB — CBC
HCT: 36.2 % — ABNORMAL LOW (ref 39.0–52.0)
Hemoglobin: 12.3 g/dL — ABNORMAL LOW (ref 13.0–17.0)
MCH: 28 pg (ref 26.0–34.0)
MCHC: 34 g/dL (ref 30.0–36.0)
MCV: 82.3 fL (ref 80.0–100.0)
Platelets: 180 10*3/uL (ref 150–400)
RBC: 4.4 MIL/uL (ref 4.22–5.81)
RDW: 15.2 % (ref 11.5–15.5)
WBC: 8.3 10*3/uL (ref 4.0–10.5)
nRBC: 0 % (ref 0.0–0.2)

## 2020-04-18 LAB — BASIC METABOLIC PANEL
Anion gap: 9 (ref 5–15)
BUN: 9 mg/dL (ref 6–20)
CO2: 27 mmol/L (ref 22–32)
Calcium: 8.3 mg/dL — ABNORMAL LOW (ref 8.9–10.3)
Chloride: 107 mmol/L (ref 98–111)
Creatinine, Ser: 1.32 mg/dL — ABNORMAL HIGH (ref 0.61–1.24)
GFR, Estimated: >60 mL/min (ref >60)
Glucose, Bld: 131 mg/dL — ABNORMAL HIGH (ref 70–99)
Potassium: 3.3 mmol/L — ABNORMAL LOW (ref 3.5–5.1)
Sodium: 143 mmol/L (ref 135–145)

## 2020-04-18 LAB — PROTIME-INR
INR: 2.3 — ABNORMAL HIGH (ref 0.8–1.2)
Prothrombin Time: 24.2 seconds — ABNORMAL HIGH (ref 11.4–15.2)

## 2020-04-18 LAB — MAGNESIUM: Magnesium: 1.8 mg/dL (ref 1.7–2.4)

## 2020-04-18 MED ORDER — WARFARIN SODIUM 10 MG PO TABS
10.0000 mg | ORAL_TABLET | Freq: Once | ORAL | Status: AC
  Administered 2020-04-18: 10 mg via ORAL
  Filled 2020-04-18: qty 1

## 2020-04-18 MED ORDER — POTASSIUM CHLORIDE CRYS ER 20 MEQ PO TBCR
40.0000 meq | EXTENDED_RELEASE_TABLET | Freq: Once | ORAL | Status: AC
  Administered 2020-04-18: 40 meq via ORAL
  Filled 2020-04-18: qty 2

## 2020-04-18 NOTE — Consult Note (Signed)
ANTICOAGULATION CONSULT NOTE  Pharmacy Consult for Warfarin Indication: Hx DVT/PE  Patient Measurements: Height: 6' (182.9 cm) Weight: (!) 140.6 kg (310 lb) IBW/kg (Calculated) : 77.6  Vital Signs: Temp: 98.9 F (37.2 C) (12/07 0401) Temp Source: Oral (12/07 0401) BP: 113/65 (12/07 0401) Pulse Rate: 91 (12/07 0401)  Labs: Recent Labs    04/16/20 1022 04/16/20 1244 04/17/20 0311 04/18/20 0438  HGB 13.9  --  11.7*  --   HCT 41.0  --  35.5*  --   PLT 203  --  160  --   LABPROT  --  24.8* 27.5* 24.2*  INR  --  2.3* 2.7* 2.3*  CREATININE 1.18  --  1.25*  --     Estimated Creatinine Clearance: 100.5 mL/min (A) (by C-G formula based on SCr of 1.25 mg/dL (H)).   Medical History: Past Medical History:  Diagnosis Date  . Clotting disorder ([place])   . Glaucoma   . History of DVT in adulthood   . History of pulmonary embolus (PE)   . Sleep apnea     Medications:  Warfarin 11.25mg  every M/Tu/W/Thurs/Fri Warfarin 7.5mg  every Sat/Sun Last dose was 7.5mg  on 12/5 Average daily dose approximately 10 mg  Assessment: 54yo male with a past medical history of DVT and PE currently on Coumadin as well as OSA who presents to the ED c/o 2 days of LLQ abdominal pain associated with subjective fevers. Pharmacy has been consulted for warfarin dosing and monitoring for hx of DVT/PE. H&H trending down, platelets WNL. I personally spoke with the patient and he tells me he has been stable on this regimen for quite some time  DDIs: Zosyn   Date   INR   Dose 12/5   2.3   7.5mg  - pt reported 12/6   2.7   5 mg 12/7   2.3   10 mg    Goal of Therapy:  INR 2-3 Monitor platelets by anticoagulation protocol: Yes   Plan:   INR therapeutic but trending back down  warfarin 10 mg po x 1 tonight   This is approximately the average home dose   INR daily and adjust dose as clinically indicated  Monitor CBC per protocol  Dallie Piles, PharmD 04/18/2020 7:11 AM

## 2020-04-18 NOTE — Progress Notes (Signed)
04/18/2020  Subjective: No acute events.  Patient reports he feels better this morning with improved pain.  He's able to bend down to put his socks on.  Denies any nausea.  Had multiple bowel movements yesterday.  Vital signs: Temp:  [98 F (36.7 C)-98.9 F (37.2 C)] 98.4 F (36.9 C) (12/07 0723) Pulse Rate:  [86-98] 98 (12/07 0723) Resp:  [16-20] 20 (12/07 0723) BP: (109-138)/(64-92) 138/92 (12/07 0723) SpO2:  [96 %-98 %] 98 % (12/07 0723)   Intake/Output: 12/06 0701 - 12/07 0700 In: 2849.7 [P.O.:30; I.V.:2619.7; IV Piggyback:200] Out: -  Last BM Date: 04/18/20 (loose)  Physical Exam: Constitutional: No acute distress Abdomen:  Soft, non-distended, with tenderness in left abdomen, non-peritoneal.  Labs:  Recent Labs    04/17/20 0311 04/18/20 0726  WBC 8.0 8.3  HGB 11.7* 12.3*  HCT 35.5* 36.2*  PLT 160 180   Recent Labs    04/17/20 0311 04/18/20 0726  NA 141 143  K 3.5 3.3*  CL 103 107  CO2 30 27  GLUCOSE 144* 131*  BUN 11 9  CREATININE 1.25* 1.32*  CALCIUM 8.2* 8.3*   Recent Labs    04/17/20 0311 04/18/20 0438  LABPROT 27.5* 24.2*  INR 2.7* 2.3*    Imaging: No results found.  Assessment/Plan: This is a 54 y.o. male with acute diverticulitis.  --WBC remains normal today.  His exam is improving and reassuring.   --Will start clear liquid diet today. --Continue IV fluids, IV abx, replete K --OOB, ambulate --Continue Coumadin   Melvyn Neth, [place]

## 2020-04-19 LAB — CBC
HCT: 36.4 % — ABNORMAL LOW (ref 39.0–52.0)
Hemoglobin: 11.8 g/dL — ABNORMAL LOW (ref 13.0–17.0)
MCH: 27 pg (ref 26.0–34.0)
MCHC: 32.4 g/dL (ref 30.0–36.0)
MCV: 83.3 fL (ref 80.0–100.0)
Platelets: 162 10*3/uL (ref 150–400)
RBC: 4.37 MIL/uL (ref 4.22–5.81)
RDW: 15 % (ref 11.5–15.5)
WBC: 5.7 10*3/uL (ref 4.0–10.5)
nRBC: 0 % (ref 0.0–0.2)

## 2020-04-19 LAB — BASIC METABOLIC PANEL
Anion gap: 9 (ref 5–15)
BUN: 6 mg/dL (ref 6–20)
CO2: 24 mmol/L (ref 22–32)
Calcium: 8.1 mg/dL — ABNORMAL LOW (ref 8.9–10.3)
Chloride: 108 mmol/L (ref 98–111)
Creatinine, Ser: 1.11 mg/dL (ref 0.61–1.24)
GFR, Estimated: >60 mL/min (ref >60)
Glucose, Bld: 128 mg/dL — ABNORMAL HIGH (ref 70–99)
Potassium: 3.4 mmol/L — ABNORMAL LOW (ref 3.5–5.1)
Sodium: 141 mmol/L (ref 135–145)

## 2020-04-19 LAB — MAGNESIUM: Magnesium: 1.8 mg/dL (ref 1.7–2.4)

## 2020-04-19 LAB — PROTIME-INR
INR: 2.3 — ABNORMAL HIGH (ref 0.8–1.2)
Prothrombin Time: 24.3 seconds — ABNORMAL HIGH (ref 11.4–15.2)

## 2020-04-19 MED ORDER — WARFARIN SODIUM 10 MG PO TABS
10.0000 mg | ORAL_TABLET | Freq: Once | ORAL | Status: AC
  Administered 2020-04-19: 10 mg via ORAL
  Filled 2020-04-19: qty 1

## 2020-04-19 NOTE — Progress Notes (Signed)
ANTICOAGULATION CONSULT NOTE  Pharmacy Consult for Warfarin Indication: Hx DVT/PE  Patient Measurements: Height: 6' (182.9 cm) Weight: (!) 140.6 kg (310 lb) IBW/kg (Calculated) : 77.6  Vital Signs: Temp: 97.9 F (36.6 C) (12/08 1134) Temp Source: Oral (12/08 1134) BP: 152/94 (12/08 1134) Pulse Rate: 76 (12/08 1134)  Labs: Recent Labs    04/17/20 0311 04/17/20 0311 04/18/20 0438 04/18/20 0726 04/19/20 0445  HGB 11.7*   < >  --  12.3* 11.8*  HCT 35.5*  --   --  36.2* 36.4*  PLT 160  --   --  180 162  LABPROT 27.5*  --  24.2*  --  24.3*  INR 2.7*  --  2.3*  --  2.3*  CREATININE 1.25*  --   --  1.32* 1.11   < > = values in this interval not displayed.    Estimated Creatinine Clearance: 113.2 mL/min (by C-G formula based on SCr of 1.11 mg/dL).   Medical History: Past Medical History:  Diagnosis Date  . Clotting disorder ([place])   . Glaucoma   . History of DVT in adulthood   . History of pulmonary embolus (PE)   . Sleep apnea     Medications:  Warfarin 11.25mg  every M/Tu/W/Thurs/Fri Warfarin 7.5mg  every Sat/Sun Last dose was 7.5mg  on 12/5 Average daily dose approximately 10 mg  Assessment: 54yo male with a past medical history of DVT and PE currently on Coumadin as well as OSA who presents to the ED c/o 2 days of LLQ abdominal pain associated with subjective fevers. Pharmacy has been consulted for warfarin dosing and monitoring for hx of DVT/PE. H&H trending down, platelets WNL. I personally spoke with the patient and he tells me he has been stable on this regimen for quite some time  DDIs: Zosyn   Date   INR   Dose 12/5   2.3   7.5mg  - pt reported 12/6   2.7   5 mg 12/7   2.3   10 mg   12/8   2.3   10 mg   Goal of Therapy:  INR 2-3 Monitor platelets by anticoagulation protocol: Yes   Plan:   warfarin 10 mg po x 1 tonight   This is approximately the average home dose   INR daily and adjust dose as clinically indicated  Monitor CBC per  protocol  Dorothe Pea, PharmD, BCPS 04/19/2020 12:14 PM

## 2020-04-19 NOTE — Progress Notes (Addendum)
[facility] SURGICAL PROGRESS NOTE (cpt [phone])  Hospital Day(s): 3.   Interval History: Patient seen and examined, no acute events or new complaints overnight. Patient reports he overall feels better but still with fairly localized left lateral abdominal tenderness but again this is reportedly improving. He denies fever, chills, nausea, emesis. He continues to remain without leukocytosis, WBC 5.7K. Renal function improved with sCr - 1.11. He does have mild hypokalemia to 3.4. No other significant electrolyte derangements. He was started on CLD yesterday and tolerating this well. He continues to have multiple BMs (x7) in the last 24 hours.   Review of Systems:  Constitutional: denies fever, chills  HEENT: denies cough or congestion  Respiratory: denies any shortness of breath  Cardiovascular: denies chest pain or palpitations  Gastrointestinal: + abdominal pain (improving), denied N/V, or diarrhea/and bowel function as per interval history Genitourinary: denies burning with urination or urinary frequency  Vital signs in last 24 hours: [min-max] current  Temp:  [97.7 F (36.5 C)-99.2 F (37.3 C)] 98 F (36.7 C) (12/08 0427) Pulse Rate:  [74-84] 74 (12/08 0427) Resp:  [16-20] 16 (12/08 0427) BP: (115-126)/(69-72) 117/72 (12/08 0427) SpO2:  [96 %-100 %] 97 % (12/08 0427)     Height: 6' (182.9 cm) Weight: (!) 140.6 kg BMI (Calculated): 42.03   Intake/Output last 2 shifts:  12/07 0701 - 12/08 0700 In: 2607.8 [P.O.:1260; I.V.:1273.7; IV Piggyback:74.1] Out: 800 [Urine:800]   Physical Exam:  Constitutional: alert, cooperative and no distress  HENT: normocephalic without obvious abnormality  Eyes: PERRL, EOM's grossly intact and symmetric  Respiratory: breathing non-labored at rest  Cardiovascular: regular rate and sinus rhythm  Gastrointestinal: Soft, remains point tender to the LLQ but improving, non-distended, no rebound/guarding Musculoskeletal: No edema or wounds,  motor and sensation grossly intact, NT   Labs:  CBC Latest Ref Rng & Units 04/19/2020 04/18/2020 04/17/2020  WBC 4.0 - 10.5 K/uL 5.7 8.3 8.0  Hemoglobin 13.0 - 17.0 g/dL 11.8(L) 12.3(L) 11.7(L)  Hematocrit 39 - 52 % 36.4(L) 36.2(L) 35.5(L)  Platelets 150 - 400 K/uL 162 180 160   CMP Latest Ref Rng & Units 04/19/2020 04/18/2020 04/17/2020  Glucose 70 - 99 mg/dL 128(H) 131(H) 144(H)  BUN 6 - 20 mg/dL 6 9 11   Creatinine 0.61 - 1.24 mg/dL 1.11 1.32(H) 1.25(H)  Sodium 135 - 145 mmol/L 141 143 141  Potassium 3.5 - 5.1 mmol/L 3.4(L) 3.3(L) 3.5  Chloride 98 - 111 mmol/L 108 107 103  CO2 22 - 32 mmol/L 24 27 30   Calcium 8.9 - 10.3 mg/dL 8.1(L) 8.3(L) 8.2(L)  Total Protein 6.5 - 8.1 g/dL - - 6.2(L)  Total Bilirubin 0.3 - 1.2 mg/dL - - 3.4(H)  Alkaline Phos 38 - 126 U/L - - 56  AST 15 - 41 U/L - - 17  ALT 0 - 44 U/L - - 19     Imaging studies: No new pertinent imaging studies   Assessment/Plan: (ICD-10's: K55.20) 54 y.o. male with improvement in abdominal pain admitted with complicated diverticulitis with contained perforation without abscess   - Trial on FLD; advance diet as tolerated  - Continue IVF resuscitation; wean             - Continue IV ABx (Zosyn)              - Monitor abdominal examination             - Pain control prn; antiemetics prn             -  Can hold off on re-imaging for now pending clinical condition             - No need for emergent surgical intervention. He understands that if he were to fail conservative management or clinically deteriorate, then we would need to pursue more emergent intervention and likely temporizing colostomy.              - Monitor leukocytosis             - Monitor renal function; electrolytes             - Mobilization as feasible             - DVT prophylaxis    - Discharge Planning; Pending clinical condition, he may be ready for discharge in next 24-48 hours   All of the above findings and recommendations were discussed with the  patient, and the medical team, and all of patient's questions were answered to his expressed satisfaction.  -- Edison Simon, PA-C [facility] 04/19/2020, 7:30 AM [phone] M-F: 7am - 4pm  I saw and evaluated the patient.  I agree with the above documentation, exam, and plan, which I have edited where appropriate. Fredirick Maudlin  10:39 AM

## 2020-04-19 NOTE — Progress Notes (Signed)
Mobility Specialist - Progress Note   04/19/20 1100  Mobility  Activity Ambulated in room  Level of Assistance Independent  Assistive Device None  Distance Ambulated (ft) 80 ft  Mobility Response Tolerated well  Mobility performed by Mobility specialist  $Mobility charge 1 Mobility    Pre-mobility: 95 HR, 96% SpO2 Post-mobility: 98 HR, 95% SpO2   Pt was sitting EOB with family present in room upon arrival. Pt utilizing room air and agreed to session. Pt denied any pain, nausea, or fatigue. Pt independent in ambulation without AD. Pt ambulating at increased speed. No LOB noted. Pt denied dizziness, SOB, or weakness. Overall, pt tolerated session well. Pt was left EOB with all needs in reach.    Kathee Delton Mobility Specialist 04/19/20, 11:29 AM

## 2020-04-20 ENCOUNTER — Encounter: Payer: Self-pay | Admitting: General Surgery

## 2020-04-20 LAB — BASIC METABOLIC PANEL
Anion gap: 10 (ref 5–15)
BUN: 6 mg/dL (ref 6–20)
CO2: 24 mmol/L (ref 22–32)
Calcium: 8.3 mg/dL — ABNORMAL LOW (ref 8.9–10.3)
Chloride: 108 mmol/L (ref 98–111)
Creatinine, Ser: 1.13 mg/dL (ref 0.61–1.24)
GFR, Estimated: >60 mL/min (ref >60)
Glucose, Bld: 110 mg/dL — ABNORMAL HIGH (ref 70–99)
Potassium: 3.5 mmol/L (ref 3.5–5.1)
Sodium: 142 mmol/L (ref 135–145)

## 2020-04-20 LAB — PROTIME-INR
INR: 2.6 — ABNORMAL HIGH (ref 0.8–1.2)
Prothrombin Time: 27.2 seconds — ABNORMAL HIGH (ref 11.4–15.2)

## 2020-04-20 LAB — CBC
HCT: 35.7 % — ABNORMAL LOW (ref 39.0–52.0)
Hemoglobin: 11.9 g/dL — ABNORMAL LOW (ref 13.0–17.0)
MCH: 27.5 pg (ref 26.0–34.0)
MCHC: 33.3 g/dL (ref 30.0–36.0)
MCV: 82.4 fL (ref 80.0–100.0)
Platelets: 190 10*3/uL (ref 150–400)
RBC: 4.33 MIL/uL (ref 4.22–5.81)
RDW: 14.9 % (ref 11.5–15.5)
WBC: 5.4 10*3/uL (ref 4.0–10.5)
nRBC: 0 % (ref 0.0–0.2)

## 2020-04-20 MED ORDER — IBUPROFEN 600 MG PO TABS: 600.0000 mg | ORAL_TABLET | Freq: Four times a day (QID) | ORAL | 0 refills | Status: AC | PRN

## 2020-04-20 MED ORDER — AMOXICILLIN-POT CLAVULANATE 875-125 MG PO TABS: 1.0000 | ORAL_TABLET | Freq: Two times a day (BID) | ORAL | 0 refills | Status: AC

## 2020-04-20 NOTE — Discharge Summary (Addendum)
[facility] SURGICAL DISCHARGE SUMMARY (cpt: [phone])  Patient ID: Bruce Jenkins. MRN: [id number] DOB/Jenkins: 52-Jan-1967 52 y.o.  Admit date: 04/16/2020 Discharge date: 04/20/2020  Discharge Diagnoses Patient Active Problem List   Diagnosis Date Noted   Diverticulitis of colon with perforation 04/16/2020    Consultants None  Procedures None  HPI: Bruce Jenkins. is a 54 y.o. male with a history of PE/DVT on coumadin who presents for assessment of 2 days of left lower quadrant abdominal pain associate with subjective fevers.  Patient states he has had episodes like this in the past several months ago but this resolved on their own without any intervention or evaluation.  He denies any other acute symptoms including headache, nausea, vomiting, diarrhea, dysuria, back pain, rash, burning with urination, blood in his stool, blood in his urine, cough or other acute complaints.  No clearly feeding aggravating factors.   Evaluation in the emergency department included lab work that did not show a leukocytosis, but did show an increased bilirubin of unclear etiology.  INR is therapeutic at 2.3.  CT scan of the abdomen and pelvis was performed that demonstrated a contained perforation of sigmoid diverticulitis.  General surgery has been consulted for further evaluation and management.   Hospital Course: He was admitted to general surgery service and conservative management was initiated. On HD2 his pain had improved and remained without leukocytosis, and diet was initiated. Throughout his admission, he never developed fever nor leukocytosis and made consistent improvements with pain. On the day of discharge, he remained without leukocytosis, his pain had significantly improved, and her was tolerating a diet without issues. The remainder of patient's hospital course was essentially unremarkable, and discharge planning was initiated accordingly with patient safely able to be discharged  home with appropriate discharge instructions, antibiotics (Augmentin x 10 days), pain control, and outpatient follow-up after all of his questions were answered to his expressed satisfaction.   Discharge Condition: Good    Physical Examination:  Constitutional: alert, cooperative and no distress  HENT: normocephalic without obvious abnormality  Eyes: PERRL, EOM's grossly intact and symmetric  Respiratory: breathing non-labored at rest  Cardiovascular: regular rate and sinus rhythm  Gastrointestinal: Soft, near resolution in LLQ soreness, very minimal if any, non-distended, no rebound/guarding Musculoskeletal: No edema or wounds, motor and sensation grossly intact, NT    Allergies as of 04/20/2020   Not on File      Medication List     TAKE these medications    amoxicillin-clavulanate 875-125 MG tablet Commonly known as: Augmentin Take 1 tablet by mouth 2 (two) times daily for 10 days.   atorvastatin 40 MG tablet Commonly known as: LIPITOR Take 40 mg by mouth daily.   chlorthalidone 25 MG tablet Commonly known as: HYGROTON Take 25 mg by mouth daily.   doxepin 10 MG capsule Commonly known as: SINEQUAN Take 10 mg by mouth at bedtime.   escitalopram 20 MG tablet Commonly known as: LEXAPRO Take 20 mg by mouth daily.   ibuprofen 600 MG tablet Commonly known as: ADVIL Take 1 tablet (600 mg total) by mouth every 6 (six) hours as needed for mild pain.   latanoprost 0.005 % ophthalmic solution Commonly known as: XALATAN Place 1 drop into both eyes at bedtime.   potassium chloride SA 20 MEQ tablet Commonly known as: KLOR-CON Take 20 mEq by mouth daily.   Rhopressa 0.02 % Soln Generic drug: Netarsudil Dimesylate Place 1 drop into both eyes every evening.   warfarin  7.5 MG tablet Commonly known as: COUMADIN Take 7.5-11.25 mg by mouth See admin instructions. Take 1 tablets (11.25mg ) by mouth every Monday, Tuesday, Wednesday, Thursday and Friday and take 1 tablet  (7.5mg ) by mouth every Saturday and Sunday          Follow-up Information     Fredirick Maudlin, MD. Schedule an appointment as soon as possible for a visit in 2 week(s).   Specialty: General Surgery Why: 2-3 week hospital follow up, complicated diverticulitis  Contact information: [address] [place] [phone]                  Time spent on discharge management including discussion of hospital course, clinical condition, outpatient instructions, prescriptions, and follow up with the patient and members of the medical team: >30 minutes  -- Edison Simon , PA-C [facility]  04/20/2020, 9:25 AM [phone] M-F: 7am - 4pm  The patient had departed the hospital prior to my seeing him today.  I discussed his case with Mr. Olean Ree and concur with the above documentation.

## 2020-04-20 NOTE — Progress Notes (Incomplete)
ANTICOAGULATION CONSULT NOTE  Pharmacy Consult for Warfarin Indication: Hx DVT/PE  Patient Measurements: Height: 6' (182.9 cm) Weight: (!) 140.6 kg (310 lb) IBW/kg (Calculated) : 77.6  Vital Signs: Temp: 98.2 F (36.8 C) (12/09 0811) Temp Source: Oral (12/09 0811) BP: 108/61 (12/09 0811) Pulse Rate: 82 (12/09 0811)  Labs: Recent Labs    04/18/20 0438 04/18/20 0726 04/18/20 0726 04/19/20 0445 04/20/20 0505  HGB  --  12.3*   < > 11.8* 11.9*  HCT  --  36.2*  --  36.4* 35.7*  PLT  --  180  --  162 190  LABPROT 24.2*  --   --  24.3* 27.2*  INR 2.3*  --   --  2.3* 2.6*  CREATININE  --  1.32*  --  1.11 1.13   < > = values in this interval not displayed.    Estimated Creatinine Clearance: 111.2 mL/min (by C-G formula based on SCr of 1.13 mg/dL).   Medical History: Past Medical History:  Diagnosis Date  . Clotting disorder ([place])   . Glaucoma   . History of DVT in adulthood   . History of pulmonary embolus (PE)   . Sleep apnea     Medications:  Warfarin 11.25mg  every M/Tu/W/Thurs/Fri Warfarin 7.5mg  every Sat/Sun Last dose was 7.5mg  on 12/5 Average daily dose approximately 10 mg  Assessment: 54yo male with a past medical history of DVT and PE currently on Coumadin as well as OSA who presents to the ED c/o 2 days of LLQ abdominal pain associated with subjective fevers. Pharmacy has been consulted for warfarin dosing and monitoring for hx of DVT/PE. H&H stable. Per patient, has been stable on this regimen for quite some time.  DDIs: Zosyn 12/5 >    Date   INR   Dose 12/5   2.3   7.5mg  - pt reported 12/6   2.7   5 mg 12/7   2.3   10 mg   12/8   2.3   10 mg  12/9   2.6   7.5 mg    Goal of Therapy:  INR 2-3 Monitor platelets by anticoagulation protocol: Yes   Plan:   Warfarin 7.5 mg po x 1 tonight   Anticipate returning to home regimen once antibiotic course is finished   INR daily and adjust dose as clinically indicated  Monitor CBC per  protocol  Dorothe Pea, PharmD, BCPS 04/20/2020 9:22 AM

## 2020-04-20 NOTE — Progress Notes (Signed)
Vitals stable, assessment stable.  Pain 1/10, no s/s infection. Verified d/c home order. AVS reviewed with patient, educated in medications and reasons to call MD per AVS, diet- education with teach back.  Reviewed appointment time.  Walked with patient to exit, declined w/c.

## 2020-04-21 LAB — CULTURE, BLOOD (ROUTINE X 2)
Culture: NO GROWTH
Culture: NO GROWTH

## 2020-05-04 ENCOUNTER — Ambulatory Visit (INDEPENDENT_AMBULATORY_CARE_PROVIDER_SITE_OTHER): Payer: BC Managed Care – PPO | Admitting: General Surgery

## 2020-05-04 ENCOUNTER — Encounter: Payer: Self-pay | Admitting: General Surgery

## 2020-05-04 ENCOUNTER — Other Ambulatory Visit: Payer: Self-pay

## 2020-05-04 VITALS — BP 143/89 | HR 95 | Temp 99.1°F | Ht 72.0 in | Wt 310.0 lb

## 2020-05-04 DIAGNOSIS — K5720 Diverticulitis of large intestine with perforation and abscess without bleeding: Secondary | ICD-10-CM

## 2020-05-04 NOTE — Progress Notes (Signed)
Patient ID: Bruce Age., male   DOB: 01-Dec-1965, 54 y.o.   MRN: [id number]  Chief Complaint  Patient presents with  . Follow-up    New to office, ED hosp fup w/ Dr. Celine Ahr for diverticulitis    HPI Bruce Jenkins. is a 54 y.o. male.   He is here today for follow-up after recent hospitalization for diverticulitis.  I have copied the history of present illness from my admission H&P here:  "He has a past medical history of DVT and PE currently on Coumadin as well as OSA who presents for assessment of 2 days of left lower quadrant abdominal pain associate with subjective fevers.  Patient states he has had episodes like this in the past several months ago but this resolved on their own without any intervention or evaluation.  He denies any other acute symptoms including headache, nausea, vomiting, diarrhea, dysuria, back pain, rash, burning with urination, blood in his stool, blood in his urine, cough or other acute complaints.  No clearly feeding aggravating factors.   Evaluation in the emergency department included lab work that did not show a leukocytosis, but did show an increased bilirubin of unclear etiology.  INR is therapeutic at 2.3.  CT scan of the abdomen and pelvis was performed that demonstrated a contained perforation of sigmoid diverticulitis.  General surgery has been consulted for further evaluation and management."  He was managed conservatively with IV fluid hydration, intravenous antibiotics, and a restricted diet.  He did well and was discharged after about 6 days in the hospital.  He is here today to discuss possible elective sigmoid colectomy.  Reports that since leaving the hospital, he has continued to be fairly restrictive with his diet, avoiding heavy foods.  He says that he is trying to lose weight in order to help his high blood pressure and insulin resistance.  He reports that his pain has completely resolved, but he still has some loose stools that he attributes to the  lack of significant solid intake.  He denies any fevers or chills.  No nausea or vomiting.  He says that he would very much like to avoid any possible future emergency surgery, as he really does not want to have to deal with a colostomy.   Past Medical History:  Diagnosis Date  . Clotting disorder ([place])   . Glaucoma   . History of DVT in adulthood   . History of pulmonary embolus (PE)   . Sleep apnea     Past Surgical History:  Procedure Laterality Date  . COLONOSCOPY WITH PROPOFOL N/A 12/29/2017   Procedure: COLONOSCOPY WITH PROPOFOL;  Surgeon: Lollie Sails, MD;  Location[facility];  Service: Endoscopy;  Laterality: N/A;  . EYE SURGERY Right 02/28/2014  . FRACTURE SURGERY    . IVC FILTER INSERTION  2008  . MANDIBLE FRACTURE SURGERY    . pr insert anter drainage dev w/o extraoc reservoir Right 02/27/2014  . UMBILICAL GRANULOMA EXCISION  07/2013    History reviewed. No pertinent family history.  Social History Social History   Tobacco Use  . Smoking status: Former Research scientist (life sciences)  . Smokeless tobacco: Never Used  Vaping Use  . Vaping Use: Never used  Substance Use Topics  . Alcohol use: No  . Drug use: No    No Known Allergies  Current Outpatient Medications  Medication Sig Dispense Refill  . atorvastatin (LIPITOR) 40 MG tablet Take 40 mg by mouth daily.    . chlorthalidone (HYGROTON) 25  MG tablet Take 25 mg by mouth daily.    Marland Kitchen doxepin (SINEQUAN) 10 MG capsule Take 10 mg by mouth at bedtime.    Marland Kitchen escitalopram (LEXAPRO) 20 MG tablet Take 20 mg by mouth daily.     Marland Kitchen latanoprost (XALATAN) 0.005 % ophthalmic solution Place 1 drop into both eyes at bedtime.     . potassium chloride SA (K-DUR,KLOR-CON) 20 MEQ tablet Take 20 mEq by mouth daily.    . RHOPRESSA 0.02 % SOLN Place 1 drop into both eyes every evening.    . warfarin (COUMADIN) 7.5 MG tablet Take 7.5-11.25 mg by mouth See admin instructions. Take 1 tablets (11.25mg ) by mouth every Monday, Tuesday, Wednesday,  Thursday and Friday and take 1 tablet (7.5mg ) by mouth every Saturday and Sunday    . ibuprofen (ADVIL) 600 MG tablet Take 1 tablet (600 mg total) by mouth every 6 (six) hours as needed for mild pain. (Patient not taking: Reported on 05/04/2020) 30 tablet 0   No current facility-administered medications for this visit.    Review of Systems Review of Systems  All other systems reviewed and are negative.   Blood pressure (!) 143/89, pulse 95, temperature 99.1 F (37.3 C), temperature source Oral, height 6' (1.829 m), weight (!) 310 lb (140.6 kg), SpO2 95 %. Body mass index is 42.04 kg/m.  Physical Exam Physical Exam Constitutional:      General: He is not in acute distress.    Appearance: He is obese.  HENT:     Head: Normocephalic and atraumatic.     Nose:     Comments: Covered with a mask    Mouth/Throat:     Comments: Covered with a mask Eyes:     General: No scleral icterus.       Right eye: No discharge.        Left eye: No discharge.  Cardiovascular:     Rate and Rhythm: Normal rate and regular rhythm.  Pulmonary:     Effort: Pulmonary effort is normal.     Breath sounds: Normal breath sounds.  Abdominal:     General: Bowel sounds are normal.     Palpations: Abdomen is soft.     Tenderness: There is no abdominal tenderness. There is no guarding.     Comments: Protuberant, consistent with his level of obesity.  He has a small umbilical hernia that reduces easily.  Genitourinary:    Comments: Deferred Musculoskeletal:     Cervical back: No rigidity.     Comments: Venous stasis changes in the bilateral lower extremities.  Lymphadenopathy:     Cervical: No cervical adenopathy.  Skin:    General: Skin is warm and dry.  Neurological:     General: No focal deficit present.     Mental Status: He is alert and oriented to person, place, and time.  Psychiatric:        Mood and Affect: Mood normal.        Behavior: Behavior normal.     Data Reviewed His hospital  course from his admission was reviewed via the electronic medical record.  Results for Bruce Jenkins, Bruce Jenkins (MRN [id number]) as of 05/04/2020 12:47  Ref. Range 04/16/2020 10:22 04/17/2020 03:11 04/18/2020 07:26 04/19/2020 04:45 04/20/2020 05:05  WBC Latest Ref Range: 4.0 - 10.5 K/uL 10.5 8.0 8.3 5.7 5.4  RBC Latest Ref Range: 4.22 - 5.81 MIL/uL 5.00 4.30 4.40 4.37 4.33  Hemoglobin Latest Ref Range: 13.0 - 17.0 g/dL 13.9 11.7 (L) 12.3 (L)  11.8 (L) 11.9 (L)  HCT Latest Ref Range: 39.0 - 52.0 % 41.0 35.5 (L) 36.2 (L) 36.4 (L) 35.7 (L)  MCV Latest Ref Range: 80.0 - 100.0 fL 82.0 82.6 82.3 83.3 82.4  MCH Latest Ref Range: 26.0 - 34.0 pg 27.8 27.2 28.0 27.0 27.5  MCHC Latest Ref Range: 30.0 - 36.0 g/dL 33.9 33.0 34.0 32.4 33.3  RDW Latest Ref Range: 11.5 - 15.5 % 15.1 15.2 15.2 15.0 14.9  Platelets Latest Ref Range: 150 - 400 K/uL 203 160 180 162 190  nRBC Latest Ref Range: 0.0 - 0.2 % 0.0 0.0 0.0 0.0 0.0  These labs demonstrate a resolution of his leukocytosis while he was in the hospital.  I reviewed the CT scan performed in the emergency department on December 5.  I concur with the radiology interpretation that is copied here:  CLINICAL DATA:  Left lower abdominal pain for the past several days.  EXAM: CT ABDOMEN AND PELVIS WITH CONTRAST  TECHNIQUE: Multidetector CT imaging of the abdomen and pelvis was performed using the standard protocol following bolus administration of intravenous contrast.  CONTRAST:  157mL OMNIPAQUE IOHEXOL 300 MG/ML  SOLN  COMPARISON:  06/29/2013 and 08/11/2012.  FINDINGS: Lower chest: No acute abnormality.  Hepatobiliary: No focal liver abnormality is seen. No gallstones, gallbladder wall thickening, or biliary dilatation.  Pancreas: Unremarkable. No pancreatic ductal dilatation or surrounding inflammatory changes.  Spleen: Normal in size without focal abnormality.  Adrenals/Urinary Tract: No adrenal masses.  Kidneys normal in size, orientation  and position with symmetric enhancement. There is no collecting system contrast on the delayed sequence in either kidney. Low-attenuation renal masses consistent with cysts, an 8 mm cyst in the upper pole the left kidney and 2 adjacent cysts in the lower pole the right kidney, the largest 7.1 cm. No other masses, no stones and no hydronephrosis. Ureters normal in course and in caliber. Bladder is unremarkable.  Stomach/Bowel: Irregular wall thickening and inflammation extends along the lower descending colon, with there is contained extraluminal air anteriorly, spanning approximately 2.5 cm. This may reflect an early peridiverticular abscess. There are diverticula along this portion of the descending colon as well as the sigmoid colon. No other areas of colonic inflammation.  Normal stomach.  Normal small bowel.  Normal appendix.  Vascular/Lymphatic: Aortic atherosclerosis. No aneurysm. No enlarged lymph nodes.  Reproductive: Unremarkable.  Other: No abdominal wall hernia or abnormality. No abdominopelvic ascites.  Musculoskeletal: No fracture or acute finding.  No bone lesion.  IMPRESSION: 1. Diverticulitis of the lower descending colon complicated by a localized perforation, reflected by contained air and and surrounding inflammation. This may reflect an early peridiverticular abscess. 2. No free air. 3. No other acute abnormality within the abdomen or pelvis. 4. Aortic atherosclerosis.  Assessment This is a 54 year old man who has had several episodes of likely diverticulitis, with the most recent occurring at the beginning of December 2021.  This episode was a bit more complicated, with a small contained perforation.  He did respond well to conservative treatment, but he is eager to avoid the possibility of a colostomy in the future.  He would like to undergo elective sigmoid resection, but due to some current ongoing events in his life, he would like to defer this  until summertime.  Plan I have encouraged him to continue with his efforts to lose weight.  Not only will it help his high blood pressure and insulin resistance, it will certainly make my operation less technically challenging and reduce his  risk for post operative complications.  He reports that he is enthusiastic to do so.  We will look forward to seeing him sometime this summer, when we will schedule elective sigmoid colectomy for him.    Fredirick Maudlin 05/04/2020, 12:35 PM

## 2020-05-04 NOTE — Patient Instructions (Addendum)
Dr Celine Ahr discussed with patient to continue with weight loss to help with the healing from future surgery. Patient advised to increase Fiber intake such as Metamucil, Benefiber or Fiber Supplements. Patient advised to give our office a call a month prior to moving forward with surgical treatment.  Diverticulitis  Diverticulitis is infection or inflammation of small pouches (diverticula) in the colon that form due to a condition called diverticulosis. Diverticula can trap stool (feces) and bacteria, causing infection and inflammation. Diverticulitis may cause severe stomach pain and diarrhea. It may lead to tissue damage in the colon that causes bleeding. The diverticula may also burst (rupture) and cause infected stool to enter other areas of the abdomen. Complications of diverticulitis can include:  Bleeding.  Severe infection.  Severe pain.  Rupture (perforation) of the colon.  Blockage (obstruction) of the colon. What are the causes? This condition is caused by stool becoming trapped in the diverticula, which allows bacteria to grow in the diverticula. This leads to inflammation and infection. What increases the risk? You are more likely to develop this condition if:  You have diverticulosis. The risk for diverticulosis increases if: ? You are overweight or obese. ? You use tobacco products. ? You do not get enough exercise.  You eat a diet that does not include enough fiber. High-fiber foods include fruits, vegetables, beans, nuts, and whole grains. What are the signs or symptoms? Symptoms of this condition may include:  Pain and tenderness in the abdomen. The pain is normally located on the left side of the abdomen, but it may occur in other areas.  Fever and chills.  Bloating.  Cramping.  Nausea.  Vomiting.  Changes in bowel routines.  Blood in your stool. How is this diagnosed? This condition is diagnosed based on:  Your medical history.  A physical  exam.  Tests to make sure there is nothing else causing your condition. These tests may include: ? Blood tests. ? Urine tests. ? Imaging tests of the abdomen, including X-rays, ultrasounds, MRIs, or CT scans. How is this treated? Most cases of this condition are mild and can be treated at home. Treatment may include:  Taking over-the-counter pain medicines.  Following a clear liquid diet.  Taking antibiotic medicines by mouth.  Rest. More severe cases may need to be treated at a hospital. Treatment may include:  Not eating or drinking.  Taking prescription pain medicine.  Receiving antibiotic medicines through an IV tube.  Receiving fluids and nutrition through an IV tube.  Surgery. When your condition is under control, your health care provider may recommend that you have a colonoscopy. This is an exam to look at the entire large intestine. During the exam, a lubricated, bendable tube is inserted into the anus and then passed into the rectum, colon, and other parts of the large intestine. A colonoscopy can show how severe your diverticula are and whether something else may be causing your symptoms. Follow these instructions at home: Medicines  Take over-the-counter and prescription medicines only as told by your health care provider. These include fiber supplements, probiotics, and stool softeners.  If you were prescribed an antibiotic medicine, take it as told by your health care provider. Do not stop taking the antibiotic even if you start to feel better.  Do not drive or use heavy machinery while taking prescription pain medicine. General instructions   Follow a full liquid diet or another diet as directed by your health care provider. After your symptoms improve, your health care  provider may tell you to change your diet. He or she may recommend that you eat a diet that contains at least 25 g (25 grams) of fiber daily. Fiber makes it easier to pass stool. Healthy sources  of fiber include: ? Berries. One cup contains 4-8 grams of fiber. ? Beans or lentils. One half cup contains 5-8 grams of fiber. ? Green vegetables. One cup contains 4 grams of fiber.  Exercise for at least 30 minutes, 3 times each week. You should exercise hard enough to raise your heart rate and break a sweat.  Keep all follow-up visits as told by your health care provider. This is important. You may need a colonoscopy. Contact a health care provider if:  Your pain does not improve.  You have a hard time drinking or eating food.  Your bowel movements do not return to normal. Get help right away if:  Your pain gets worse.  Your symptoms do not get better with treatment.  Your symptoms suddenly get worse.  You have a fever.  You vomit more than one time.  You have stools that are bloody, black, or tarry. Summary  Diverticulitis is infection or inflammation of small pouches (diverticula) in the colon that form due to a condition called diverticulosis. Diverticula can trap stool (feces) and bacteria, causing infection and inflammation.  You are at higher risk for this condition if you have diverticulosis and you eat a diet that does not include enough fiber.  Most cases of this condition are mild and can be treated at home. More severe cases may need to be treated at a hospital.  When your condition is under control, your health care provider may recommend that you have an exam called a colonoscopy. This exam can show how severe your diverticula are and whether something else may be causing your symptoms. This information is not intended to replace advice given to you by your health care provider. Make sure you discuss any questions you have with your health care provider. Document Revised: 04/11/2017 Document Reviewed: 06/01/2016 Elsevier Patient Education  [place].  High-Fiber Diet Fiber, also called dietary fiber, is a type of carbohydrate that is found in fruits,  vegetables, whole grains, and beans. A high-fiber diet can have many health benefits. Your health care provider may recommend a high-fiber diet to help:  Prevent constipation. Fiber can make your bowel movements more regular.  Lower your cholesterol.  Relieve the following conditions: ? Swelling of veins in the anus (hemorrhoids). ? Swelling and irritation (inflammation) of specific areas of the digestive tract (uncomplicated diverticulosis). ? A problem of the large intestine (colon) that sometimes causes pain and diarrhea (irritable bowel syndrome, IBS).  Prevent overeating as part of a weight-loss plan.  Prevent heart disease, type 2 diabetes, and certain cancers. What is my plan? The recommended daily fiber intake in grams (g) includes:  38 g for men age 58 or younger.  30 g for men over age 2.  66 g for women age 54 or younger.  21 g for women over age 31. You can get the recommended daily intake of dietary fiber by:  Eating a variety of fruits, vegetables, grains, and beans.  Taking a fiber supplement, if it is not possible to get enough fiber through your diet. What do I need to know about a high-fiber diet?  It is better to get fiber through food sources rather than from fiber supplements. There is not a lot of research about how  effective supplements are.  Always check the fiber content on the nutrition facts label of any prepackaged food. Look for foods that contain 5 g of fiber or more per serving.  Talk with a diet and nutrition specialist (dietitian) if you have questions about specific foods that are recommended or not recommended for your medical condition, especially if those foods are not listed below.  Gradually increase how much fiber you consume. If you increase your intake of dietary fiber too quickly, you may have bloating, cramping, or gas.  Drink plenty of water. Water helps you to digest fiber. What are tips for following this plan?  Eat a wide  variety of high-fiber foods.  Make sure that half of the grains that you eat each day are whole grains.  Eat breads and cereals that are made with whole-grain flour instead of refined flour or white flour.  Eat brown rice, bulgur wheat, or millet instead of white rice.  Start the day with a breakfast that is high in fiber, such as a cereal that contains 5 g of fiber or more per serving.  Use beans in place of meat in soups, salads, and pasta dishes.  Eat high-fiber snacks, such as berries, raw vegetables, nuts, and popcorn.  Choose whole fruits and vegetables instead of processed forms like juice or sauce. What foods can I eat?  Fruits Berries. Pears. Apples. Oranges. Avocado. Prunes and raisins. Dried figs. Vegetables Sweet potatoes. Spinach. Kale. Artichokes. Cabbage. Broccoli. Cauliflower. Green peas. Carrots. Squash. Grains Whole-grain breads. Multigrain cereal. Oats and oatmeal. Brown rice. Barley. Bulgur wheat. [place]. Quinoa. Bran muffins. Popcorn. Rye wafer crackers. Meats and other proteins Navy, kidney, and pinto beans. Soybeans. Split peas. Lentils. Nuts and seeds. Dairy Fiber-fortified yogurt. Beverages Fiber-fortified soy milk. Fiber-fortified orange juice. Other foods Fiber bars. The items listed above may not be a complete list of recommended foods and beverages. Contact a dietitian for more options. What foods are not recommended? Fruits Fruit juice. Cooked, strained fruit. Vegetables Fried potatoes. Canned vegetables. Well-cooked vegetables. Grains White bread. Pasta made with refined flour. White rice. Meats and other proteins Fatty cuts of meat. Fried chicken or fried fish. Dairy Milk. Yogurt. Cream cheese. Sour cream. Fats and oils Butters. Beverages Soft drinks. Other foods Cakes and pastries. The items listed above may not be a complete list of foods and beverages to avoid. Contact a dietitian for more information. Summary  Fiber is a type of  carbohydrate. It is found in fruits, vegetables, whole grains, and beans.  There are many health benefits of eating a high-fiber diet, such as preventing constipation, lowering blood cholesterol, helping with weight loss, and reducing your risk of heart disease, diabetes, and certain cancers.  Gradually increase your intake of fiber. Increasing too fast can result in cramping, bloating, and gas. Drink plenty of water while you increase your fiber.  The best sources of fiber include whole fruits and vegetables, whole grains, nuts, seeds, and beans. This information is not intended to replace advice given to you by your health care provider. Make sure you discuss any questions you have with your health care provider. Document Revised: 03/03/2017 Document Reviewed: 03/03/2017 Elsevier Patient Education  2020 [employer].

## 2020-11-02 ENCOUNTER — Ambulatory Visit: Payer: BC Managed Care – PPO | Admitting: General Surgery

## 2021-02-19 ENCOUNTER — Encounter: Payer: Self-pay | Admitting: General Surgery

## 2023-06-23 ENCOUNTER — Ambulatory Visit
Admission: EM | Admit: 2023-06-23 | Discharge: 2023-06-23 | Disposition: A | Discharge status: Home / Self Care | Payer: BC Managed Care – PPO

## 2023-06-23 DIAGNOSIS — M79671 Pain in right foot: Secondary | ICD-10-CM | POA: Diagnosis not present

## 2023-06-23 DIAGNOSIS — M79674 Pain in right toe(s): Secondary | ICD-10-CM | POA: Diagnosis not present

## 2023-06-23 MED ORDER — PREDNISONE 10 MG (21) PO TBPK: ORAL_TABLET | Freq: Every day | ORAL | 0 refills | Status: DC

## 2023-06-23 NOTE — ED Triage Notes (Signed)
 Patient has hx of diabetes.  Right foot and great toe pain with swelling since sat. No injury

## 2023-06-23 NOTE — ED Provider Notes (Signed)
 Bruce Jenkins    CSN: [id number] Arrival date & time: 06/23/23  4098      History   Chief Complaint Chief Complaint  Patient presents with   Foot Pain   Toe Pain    HPI Bruce Jenkins. is a 58 y.o. male.   Patient presents for evaluation of right great toe pain and right midfoot pain beginning 2 days ago.  Limited range of motion, painful to bear weight.  Symptoms precipitating event, trauma or injury.  Has not occurred prior.  Has attempted use of ice and heat.  Endorses frequent consumption of red meat.  History of diabetes, controlled.  Past Medical History:  Diagnosis Date   Clotting disorder (HCC)    Glaucoma    History of DVT in adulthood    History of pulmonary embolus (PE)    Sleep apnea     Patient Active Problem List   Diagnosis Date Noted   Diverticulitis of colon with perforation 04/16/2020   Anxiety 03/18/2018   Hyperlipidemia 03/18/2018   Type 2 diabetes mellitus without complication, without long-term current use of insulin (HCC) 05/10/2016   Open angle with borderline findings and high glaucoma risk in left eye 12/20/2014   Primary open angle glaucoma of right eye, severe stage 09/13/2014   Clotting disorder (HCC) 09/07/2014   Essential hypertension 09/07/2014    Past Surgical History:  Procedure Laterality Date   COLONOSCOPY WITH PROPOFOL  N/A 12/29/2017   Procedure: COLONOSCOPY WITH PROPOFOL ;  Surgeon: Deveron Fly, MD;  Location: [facility] ENDOSCOPY;  Service: Endoscopy;  Laterality: N/A;   EYE SURGERY Right 02/28/2014   FRACTURE SURGERY     IVC FILTER INSERTION  2008   MANDIBLE FRACTURE SURGERY     pr insert anter drainage dev w/o extraoc reservoir Right 02/27/2014   UMBILICAL GRANULOMA EXCISION  07/2013       Home Medications    Prior to Admission medications   Medication Sig Start Date End Date Taking? Authorizing Provider  atorvastatin (LIPITOR) 40 MG tablet Take 40 mg by mouth daily.   Yes [provider]   chlorthalidone (HYGROTON) 25 MG tablet Take 25 mg by mouth daily.   Yes [provider]  doxepin  (SINEQUAN ) 10 MG capsule Take 10 mg by mouth at bedtime.   Yes [provider]  escitalopram  (LEXAPRO ) 20 MG tablet Take 20 mg by mouth daily.    Yes [provider]  OZEMPIC, 1 MG/DOSE, 4 MG/3ML SOPN INJECT 0.75 MLS (1 MG TOTAL) SUBCUTANEOUSLY ONCE A WEEK. (PA DENIED) 06/18/23  Yes [provider]  predniSONE  (STERAPRED UNI-PAK 21 TAB) 10 MG (21) TBPK tablet Take by mouth daily. Take 6 tabs by mouth daily  for 1 days, then 5 tabs for 1 days, then 4 tabs for 1 days, then 3 tabs for 1 days, 2 tabs for 1 days, then 1 tab by mouth daily for 1 days 06/23/23  Yes My Rinke R, NP  warfarin (COUMADIN ) 7.5 MG tablet Take 7.5-11.25 mg by mouth See admin instructions. Take 1 tablets (11.25mg ) by mouth every Monday, Tuesday, Wednesday, Thursday and Friday and take 1 tablet (7.5mg ) by mouth every Saturday and Sunday   Yes [provider]  ibuprofen  (ADVIL ) 600 MG tablet Take 1 tablet (600 mg total) by mouth every 6 (six) hours as needed for mild pain. Patient not taking: Reported on 05/04/2020 04/20/20   Schulz, Zachary R, PA-C  latanoprost  (XALATAN ) 0.005 % ophthalmic solution Place 1 drop into both eyes at  bedtime.     [provider]  potassium chloride  SA (K-DUR,KLOR-CON ) 20 MEQ tablet Take 20 mEq by mouth daily.    [provider]  RHOPRESSA 0.02 % SOLN Place 1 drop into both eyes every evening. 02/06/20   [provider]    Family History History reviewed. No pertinent family history.  Social History Social History   Tobacco Use   Smoking status: Former   Smokeless tobacco: Never  Advertising account planner   Vaping status: Never Used  Substance Use Topics   Alcohol use: No   Drug use: No     Allergies   Patient has no known allergies.   Review of Systems Review of Systems   Physical Exam Triage Vital Signs ED Triage Vitals   Encounter Vitals Group     BP 06/23/23 1034 (!) 159/89     Systolic BP Percentile --      Diastolic BP Percentile --      Pulse Rate 06/23/23 1034 72     Resp 06/23/23 1034 19     Temp 06/23/23 1034 98.9 F (37.2 C)     Temp Source 06/23/23 1034 Oral     SpO2 06/23/23 1034 97 %     Weight --      Height --      Head Circumference --      Peak Flow --      Pain Score 06/23/23 1033 8     Pain Loc --      Pain Education --      Exclude from Growth Chart --    No data found.  Updated Vital Signs BP (!) 159/89 (BP Location: Left Arm)   Pulse 72   Temp 98.9 F (37.2 C) (Oral)   Resp 19   SpO2 97%   Visual Acuity Right Eye Distance:   Left Eye Distance:   Bilateral Distance:    Right Eye Near:   Left Eye Near:    Bilateral Near:     Physical Exam Constitutional:      Appearance: Normal appearance.  HENT:     Head: Normocephalic.  Eyes:     Extraocular Movements: Extraocular movements intact.  Pulmonary:     Effort: Pulmonary effort is normal.  Musculoskeletal:     Comments: Moderate swelling and tenderness at the base metatarsal extending into the midfoot without point tenderness, ecchymosis or deformity, painful to bear weight but able to do so, limited range of motion of right great toe, unable to flex, sensation intact, capillary refill less than 3, 2+ pedal pulse  Neurological:     Mental Status: He is alert and oriented to person, place, and time. Mental status is at baseline.      UC Treatments / Results  Labs (all labs ordered are listed, but only abnormal results are displayed) Labs Reviewed  URIC ACID    EKG   Radiology No results found.  Procedures Procedures (including critical care time)  Medications Ordered in UC Medications - No data to display  Initial Impression / Assessment and Plan / UC Course  I have reviewed the triage vital signs and the nursing notes.  Pertinent labs & imaging results that were available during my care of  the patient were reviewed by me and considered in my medical decision making (see chart for details).  Right great toe pain, right foot pain  Etiology most likely inflammatory, uric acid level pending scribed prednisone , advised to monitor diabetes levels closely, recommended continue supportive  care with walking referral given to podiatry Final Clinical Impressions(s) / UC Diagnoses   Final diagnoses:  Great toe pain, right  Right foot pain     Discharge Instructions      Today you are evaluated by the right toe and foot, I believe this to be caused by gout for bone arthritis  Begin prednisone  every morning as directed with food, will temporarily increase your blood sugars, monitor closely  May use Tylenol  or topical medications in addition  May continue use of ice and heat  May elevate whenever sitting and lying  If your symptoms continue to persist you may follow-up with your primary doctor or podiatrist whose information is on the front page   ED Prescriptions     Medication Sig Dispense Auth. Provider   predniSONE  (STERAPRED UNI-PAK 21 TAB) 10 MG (21) TBPK tablet Take by mouth daily. Take 6 tabs by mouth daily  for 1 days, then 5 tabs for 1 days, then 4 tabs for 1 days, then 3 tabs for 1 days, 2 tabs for 1 days, then 1 tab by mouth daily for 1 days 21 tablet Tawnia Schirm, Maybelle Spatz, NP      PDMP not reviewed this encounter.   Reena Canning, NP 06/23/23 1105

## 2023-06-23 NOTE — Discharge Instructions (Signed)
 Today you are evaluated by the right toe and foot, I believe this to be caused by gout for bone arthritis  Begin prednisone  every morning as directed with food, will temporarily increase your blood sugars, monitor closely  May use Tylenol  or topical medications in addition  May continue use of ice and heat  May elevate whenever sitting and lying  If your symptoms continue to persist you may follow-up with your primary doctor or podiatrist whose information is on the front page

## 2023-06-24 LAB — URIC ACID: Uric Acid: 7.4 mg/dL (ref 3.8–8.4)

## 2023-11-04 ENCOUNTER — Encounter: Payer: Self-pay | Admitting: Internal Medicine

## 2023-11-05 ENCOUNTER — Ambulatory Visit
Admission: RE | Admit: 2023-11-05 | Discharge: 2023-11-05 | Disposition: A | Discharge status: Home / Self Care | Attending: Internal Medicine | Admitting: Internal Medicine

## 2023-11-05 ENCOUNTER — Ambulatory Visit: Admitting: Anesthesiology

## 2023-11-05 ENCOUNTER — Encounter: Payer: Self-pay | Admitting: Internal Medicine

## 2023-11-05 ENCOUNTER — Encounter
Admission: RE | Disposition: A | Discharge status: Home / Self Care | Payer: Self-pay | Source: Home / Self Care | Attending: Internal Medicine

## 2023-11-05 ENCOUNTER — Other Ambulatory Visit: Payer: Self-pay

## 2023-11-05 DIAGNOSIS — Z86718 Personal history of other venous thrombosis and embolism: Secondary | ICD-10-CM | POA: Insufficient documentation

## 2023-11-05 DIAGNOSIS — Z83719 Family history of colon polyps, unspecified: Secondary | ICD-10-CM | POA: Diagnosis not present

## 2023-11-05 DIAGNOSIS — E66813 Obesity, class 3: Secondary | ICD-10-CM | POA: Insufficient documentation

## 2023-11-05 DIAGNOSIS — F419 Anxiety disorder, unspecified: Secondary | ICD-10-CM | POA: Insufficient documentation

## 2023-11-05 DIAGNOSIS — E119 Type 2 diabetes mellitus without complications: Secondary | ICD-10-CM | POA: Diagnosis not present

## 2023-11-05 DIAGNOSIS — Z1211 Encounter for screening for malignant neoplasm of colon: Secondary | ICD-10-CM | POA: Insufficient documentation

## 2023-11-05 DIAGNOSIS — I10 Essential (primary) hypertension: Secondary | ICD-10-CM | POA: Diagnosis not present

## 2023-11-05 DIAGNOSIS — Z6841 Body Mass Index (BMI) 40.0 and over, adult: Secondary | ICD-10-CM | POA: Insufficient documentation

## 2023-11-05 DIAGNOSIS — K5730 Diverticulosis of large intestine without perforation or abscess without bleeding: Secondary | ICD-10-CM | POA: Insufficient documentation

## 2023-11-05 DIAGNOSIS — Z87891 Personal history of nicotine dependence: Secondary | ICD-10-CM | POA: Insufficient documentation

## 2023-11-05 DIAGNOSIS — Z86711 Personal history of pulmonary embolism: Secondary | ICD-10-CM | POA: Insufficient documentation

## 2023-11-05 HISTORY — DX: Type 2 diabetes mellitus without complications: E11.9

## 2023-11-05 HISTORY — DX: Anxiety disorder, unspecified: F41.9

## 2023-11-05 HISTORY — DX: Diverticulitis of large intestine with perforation and abscess without bleeding: K57.20

## 2023-11-05 HISTORY — DX: Essential (primary) hypertension: I10

## 2023-11-05 HISTORY — PX: COLONOSCOPY: SHX5424

## 2023-11-05 LAB — GLUCOSE, CAPILLARY: Glucose-Capillary: 110 mg/dL — ABNORMAL HIGH (ref 70–99)

## 2023-11-05 SURGERY — COLONOSCOPY
Anesthesia: General

## 2023-11-05 MED ORDER — PROPOFOL 10 MG/ML IV BOLUS
INTRAVENOUS | Status: DC | PRN
  Administered 2023-11-05 (×2): 50 mg via INTRAVENOUS

## 2023-11-05 MED ORDER — DEXMEDETOMIDINE HCL IN NACL 80 MCG/20ML IV SOLN
INTRAVENOUS | Status: DC | PRN
Start: 2023-11-05 — End: 2023-11-05
  Administered 2023-11-05: 20 ug via INTRAVENOUS

## 2023-11-05 MED ORDER — LIDOCAINE HCL (CARDIAC) PF 100 MG/5ML IV SOSY
PREFILLED_SYRINGE | INTRAVENOUS | Status: DC | PRN
  Administered 2023-11-05: 100 mg via INTRAVENOUS

## 2023-11-05 MED ORDER — LIDOCAINE HCL (PF) 2 % IJ SOLN
INTRAMUSCULAR | Status: AC
  Filled 2023-11-05: qty 5

## 2023-11-05 MED ORDER — PROPOFOL 500 MG/50ML IV EMUL
INTRAVENOUS | Status: DC | PRN
Start: 2023-11-05 — End: 2023-11-05
  Administered 2023-11-05: 50 ug/kg/min via INTRAVENOUS

## 2023-11-05 MED ORDER — SODIUM CHLORIDE 0.9 % IV SOLN
INTRAVENOUS | Status: DC
  Administered 2023-11-05: 500 mL via INTRAVENOUS

## 2023-11-05 NOTE — H&P (Signed)
 Outpatient short stay form Pre-procedure 11/05/2023 12:17 PM Bruce Jenkins K. Aundria, M.D.  Primary Physician: Alm Na, M.D.  Reason for visit:  Family history of colon polyps  History of present illness:   58 year old patient presenting for family history of colon polyps- Father and sister.Patient denies any change in bowel habits, rectal bleeding or involuntary weight loss.    No current facility-administered medications for this encounter.  Current Outpatient Medications:    potassium chloride  (KLOR-CON ) 8 MEQ tablet, Take 8 mEq by mouth daily., Disp: , Rfl:    atorvastatin (LIPITOR) 40 MG tablet, Take 40 mg by mouth daily., Disp: , Rfl:    chlorthalidone (HYGROTON) 25 MG tablet, Take 25 mg by mouth daily., Disp: , Rfl:    doxepin  (SINEQUAN ) 10 MG capsule, Take 10 mg by mouth at bedtime., Disp: , Rfl:    escitalopram  (LEXAPRO ) 20 MG tablet, Take 20 mg by mouth daily. , Disp: , Rfl:    ibuprofen  (ADVIL ) 600 MG tablet, Take 1 tablet (600 mg total) by mouth every 6 (six) hours as needed for mild pain. (Patient not taking: Reported on 05/04/2020), Disp: 30 tablet, Rfl: 0   latanoprost  (XALATAN ) 0.005 % ophthalmic solution, Place 1 drop into both eyes at bedtime. , Disp: , Rfl:    OZEMPIC, 1 MG/DOSE, 4 MG/3ML SOPN, INJECT 0.75 MLS (1 MG TOTAL) SUBCUTANEOUSLY ONCE A WEEK. (PA DENIED), Disp: , Rfl:    potassium chloride  SA (K-DUR,KLOR-CON ) 20 MEQ tablet, Take 20 mEq by mouth daily., Disp: , Rfl:    predniSONE  (STERAPRED UNI-PAK 21 TAB) 10 MG (21) TBPK tablet, Take by mouth daily. Take 6 tabs by mouth daily  for 1 days, then 5 tabs for 1 days, then 4 tabs for 1 days, then 3 tabs for 1 days, 2 tabs for 1 days, then 1 tab by mouth daily for 1 days, Disp: 21 tablet, Rfl: 0   RHOPRESSA 0.02 % SOLN, Place 1 drop into both eyes every evening., Disp: , Rfl:    warfarin (COUMADIN ) 7.5 MG tablet, Take 7.5-11.25 mg by mouth See admin instructions. Take 1 tablets (11.25mg ) by mouth every Monday, Tuesday,  Wednesday, Thursday and Friday and take 1 tablet (7.5mg ) by mouth every Saturday and Sunday, Disp: , Rfl:   No medications prior to admission.     No Known Allergies   Past Medical History:  Diagnosis Date   Anxiety    Clotting disorder (HCC)    Diabetes mellitus without complication (HCC)    Diverticulitis of colon with perforation    Glaucoma    History of DVT in adulthood    History of pulmonary embolus (PE)    Hypertension    Sleep apnea     Review of systems:  Otherwise negative.    Physical Exam  Gen: Alert, oriented. Appears stated age.  HEENT: [place]/AT. PERRLA. Lungs: CTA, no wheezes. CV: RR nl S1, S2. Abd: soft, benign, no masses. BS+ Ext: No edema. Pulses 2+    Planned procedures: Proceed with colonoscopy. The patient understands the nature of the planned procedure, indications, risks, alternatives and potential complications including but not limited to bleeding, infection, perforation, damage to internal organs and possible oversedation/side effects from anesthesia. The patient agrees and gives consent to proceed.  Please refer to procedure notes for findings, recommendations and patient disposition/instructions.     Bruce Jenkins K. Aundria, M.D. Gastroenterology 11/05/2023  12:17 PM

## 2023-11-05 NOTE — Anesthesia Postprocedure Evaluation (Signed)
 Anesthesia Post Note  Patient: Bruce Jenkins.  Procedure(s) Performed: COLONOSCOPY  Patient location during evaluation: PACU Anesthesia Type: General Level of consciousness: awake and alert, oriented and patient cooperative Pain management: pain level controlled Vital Signs Assessment: post-procedure vital signs reviewed and stable Respiratory status: spontaneous breathing, nonlabored ventilation and respiratory function stable Cardiovascular status: blood pressure returned to baseline and stable Postop Assessment: adequate PO intake Anesthetic complications: no   No notable events documented.   Last Vitals:  Vitals:   11/05/23 1341 11/05/23 1351  BP: 105/83 115/84  Pulse: 90 85  Resp: 17 (!) 28  Temp: (!) 36.2 C   SpO2: 95% 97%    Last Pain:  Vitals:   11/05/23 1351  TempSrc:   PainSc: 0-No pain                 Alfonso Ruths

## 2023-11-05 NOTE — Transfer of Care (Signed)
 Immediate Anesthesia Transfer of Care Note  Patient: Bruce Jenkins.  Procedure(s) Performed: COLONOSCOPY  Patient Location: PACU  Anesthesia Type:General  Level of Consciousness: sedated  Airway & Oxygen Therapy: Patient Spontanous Breathing  Post-op Assessment: Report given to RN and Post -op Vital signs reviewed and stable  Post vital signs: Reviewed and stable  Last Vitals:  Vitals Value Taken Time  BP    Temp    Pulse 92 11/05/23 13:41  Resp 17 11/05/23 13:41  SpO2 96 % 11/05/23 13:41  Vitals shown include unfiled device data.  Last Pain:  Vitals:   11/05/23 1303  TempSrc: Tympanic  PainSc: 0-No pain      Patients Stated Pain Goal: 6 (11/05/23 1303)  Complications: No notable events documented.

## 2023-11-05 NOTE — Interval H&P Note (Signed)
 History and Physical Interval Note:  11/05/2023 12:28 PM  Bruce Jenkins.  has presented today for surgery, with the diagnosis of fAMILY HISTORY OF POLYPS.  The various methods of treatment have been discussed with the patient and family. After consideration of risks, benefits and other options for treatment, the patient has consented to  Procedure(s): COLONOSCOPY (N/A) as a surgical intervention.  The patient's history has been reviewed, patient examined, no change in status, stable for surgery.  I have reviewed the patient's chart and labs.  Questions were answered to the patient's satisfaction.     [place], Heran Campau

## 2023-11-05 NOTE — Interval H&P Note (Signed)
 History and Physical Interval Note:  11/05/2023 12:20 PM  Bruce Jenkins.  has presented today for surgery, with the diagnosis of fAMILY HISTORY OF POLYPS.  The various methods of treatment have been discussed with the patient and family. After consideration of risks, benefits and other options for treatment, the patient has consented to  Procedure(s): COLONOSCOPY (N/A) as a surgical intervention.  The patient's history has been reviewed, patient examined, no change in status, stable for surgery.  I have reviewed the patient's chart and labs.  Questions were answered to the patient's satisfaction.     Faucett, Joseguadalupe Stan

## 2023-11-05 NOTE — Anesthesia Preprocedure Evaluation (Addendum)
 Anesthesia Evaluation  Patient identified by MRN, date of birth, ID band Patient awake    Reviewed: Allergy & Precautions, NPO status , Patient's Chart, lab work & pertinent test results  History of Anesthesia Complications Negative for: history of anesthetic complications  Airway Mallampati: I   Neck ROM: Full    Dental  (+) Missing, Chipped   Pulmonary former smoker (quit greater than 5 years ago)   Pulmonary exam normal breath sounds clear to auscultation       Cardiovascular hypertension, Normal cardiovascular exam Rhythm:Regular Rate:Normal  Hx PE/DVT on warfarin   Neuro/Psych  PSYCHIATRIC DISORDERS Anxiety     negative neurological ROS     GI/Hepatic negative GI ROS,,,  Endo/Other  diabetes, Type 2  Class 3 obesity  Renal/GU negative Renal ROS     Musculoskeletal   Abdominal   Peds  Hematology negative hematology ROS (+)   Anesthesia Other Findings Last dose of Ozempic 10/22/23.  Reproductive/Obstetrics                             Anesthesia Physical Anesthesia Plan  ASA: 3  Anesthesia Plan: General   Post-op Pain Management:    Induction: Intravenous  PONV Risk Score and Plan: 2 and Propofol  infusion, TIVA and Treatment may vary due to age or medical condition  Airway Management Planned: Natural Airway  Additional Equipment:   Intra-op Plan:   Post-operative Plan:   Informed Consent: I have reviewed the patients History and Physical, chart, labs and discussed the procedure including the risks, benefits and alternatives for the proposed anesthesia with the patient or authorized representative who has indicated his/her understanding and acceptance.       Plan Discussed with: CRNA  Anesthesia Plan Comments: (LMA/GETA backup discussed.  Patient consented for risks of anesthesia including but not limited to:  - adverse reactions to medications - damage to eyes,  teeth, lips or other oral mucosa - nerve damage due to positioning  - sore throat or hoarseness - damage to heart, brain, nerves, lungs, other parts of body or loss of life  Informed patient about role of CRNA in peri- and intra-operative care.  Patient voiced understanding.)        Anesthesia Quick Evaluation

## 2023-11-05 NOTE — Op Note (Signed)
 [facility] Gastroenterology Patient Name: Bruce Jenkins Procedure Date: 11/05/2023 1:22 PM MRN: [id number] Account #: [id number] Date of Birth: 10-Apr-1966 Admit Type: Outpatient Age: 58 Room: [facility] ENDO ROOM 3 Gender: Male Note Status: Finalized Instrument Name: [id number] Procedure:             Colonoscopy Indications:           Colon cancer screening in patient at increased risk:                         Family history of 1st-degree relative with colon polyps Providers:             Ladell POUR. Kehinde Totzke MD, MD Medicines:             Propofol  per Anesthesia Complications:         No immediate complications. Estimated blood loss: None. Procedure:             Pre-Anesthesia Assessment:                        - The risks and benefits of the procedure and the                         sedation options and risks were discussed with the                         patient. All questions were answered and informed                         consent was obtained.                        - Patient identification and proposed procedure were                         verified prior to the procedure by the nurse. The                         procedure was verified in the procedure room.                        - ASA Grade Assessment: III - A patient with severe                         systemic disease.                        - After reviewing the risks and benefits, the patient                         was deemed in satisfactory condition to undergo the                         procedure.                        After obtaining informed consent, the colonoscope was                         passed under direct vision. Throughout the procedure,  the patient's blood pressure, pulse, and oxygen                         saturations were monitored continuously. The                         Colonoscope was introduced through the anus and                         advanced to the  the cecum, identified by appendiceal                         orifice and ileocecal valve. The colonoscopy was                         performed without difficulty. The patient tolerated                         the procedure well. The quality of the bowel                         preparation was good. The ileocecal valve, appendiceal                         orifice, and rectum were photographed. Findings:      The perianal and digital rectal examinations were normal. Pertinent       negatives include normal sphincter tone and no palpable rectal lesions.      A few small-mouthed diverticula were found in the sigmoid colon.      The exam was otherwise without abnormality on direct and retroflexion       views. Impression:            - Diverticulosis in the sigmoid colon.                        - The examination was otherwise normal on direct and                         retroflexion views.                        - No specimens collected. Recommendation:        - Patient has a contact number available for                         emergencies. The signs and symptoms of potential                         delayed complications were discussed with the patient.                         Return to normal activities tomorrow. Written                         discharge instructions were provided to the patient.                        - Resume previous diet.                        -  Continue present medications.                        - Repeat colonoscopy in 5 years for screening purposes.                        - Return to GI office PRN.                        - The findings and recommendations were discussed with                         the patient. Procedure Code(s):     --- Professional ---                        H9894, Colorectal cancer screening; colonoscopy on                         individual at high risk Diagnosis Code(s):     --- Professional ---                        K57.30, Diverticulosis of  large intestine without                         perforation or abscess without bleeding                        Z83.71, Family history of colonic polyps CPT copyright 2022 American [employer] Association. All rights reserved. The codes documented in this report are preliminary and upon coder review may  be revised to meet current compliance requirements. Ladell MARLA Boss MD, MD 11/05/2023 1:41:16 PM This report has been signed electronically. Number of Addenda: 0 Note Initiated On: 11/05/2023 1:22 PM Scope Withdrawal Time: 0 hours 5 minutes 48 seconds  Total Procedure Duration: 0 hours 8 minutes 40 seconds  Estimated Blood Loss:  Estimated blood loss: none.      [facility]

## 2023-11-06 ENCOUNTER — Encounter: Payer: Self-pay | Admitting: Internal Medicine

## 2024-01-24 NOTE — Progress Notes (Unsigned)
 Bruce Jenkins [address] [place], [place] [address]  Pulmonary Sleep Medicine   Office Visit Note  Patient Name: Bruce Jenkins. DOB: 1965/05/22 MRN [id number]    Chief Complaint: Obstructive Sleep Apnea visit  Brief History:  Bruce Jenkins is seen today for an initial consult for sleep evaluation on BIPAP @ 12/8 cmH2O. The patient has a 11 year history of sleep apnea. Patient is using PAP nightly.  The patient feels rested after sleeping with PAP. Prior to PAP therapy patient report the following symptom: snoring, gasping, gagging, excessive daytime sleepiness and headaches. The patient reports benefiting from PAP use. Reported sleepiness is  improved and the Epworth Sleepiness Score is 10 out of 24. The patient does take occasionally naps. The patient complains of the following: patient is in need of a replacement unit as his current machine has reached maximum lifetime expectancy and is currently out of warranty.  The compliance download shows 87% compliance with an average use time of 5 hours 15 minutes. The AHI is 1.4.  The patient does not complain of limb movements disrupting sleep. The patient continues to require PAP therapy in order to eliminate sleep apnea.   ROS  General: (-) fever, (-) chills, (-) night sweat Nose and Sinuses: (-) nasal stuffiness or itchiness, (-) postnasal drip, (-) nosebleeds, (-) sinus trouble. Mouth and Throat: (-) sore throat, (-) hoarseness. Neck: (-) swollen glands, (-) enlarged thyroid, (-) neck pain. Respiratory: - cough, - shortness of breath, - wheezing. Neurologic: - numbness, - tingling. Psychiatric: + anxiety, - depression   Current Medication: Outpatient Encounter Medications as of 01/26/2024  Medication Sig Note   atorvastatin (LIPITOR) 40 MG tablet Take 40 mg by mouth daily.    chlorthalidone (HYGROTON) 25 MG tablet Take 25 mg by mouth daily.    escitalopram  (LEXAPRO ) 20 MG tablet Take 20 mg by mouth daily.     ibuprofen   (ADVIL ) 600 MG tablet Take 1 tablet (600 mg total) by mouth every 6 (six) hours as needed for mild pain. (Patient not taking: Reported on 05/04/2020)    latanoprost  (XALATAN ) 0.005 % ophthalmic solution Place 1 drop into both eyes at bedtime.     OZEMPIC, 1 MG/DOSE, 4 MG/3ML SOPN INJECT 0.75 MLS (1 MG TOTAL) SUBCUTANEOUSLY ONCE A WEEK. (PA DENIED)    potassium chloride  SA (K-DUR,KLOR-CON ) 20 MEQ tablet Take 20 mEq by mouth daily.    RHOPRESSA 0.02 % SOLN Place 1 drop into both eyes every evening.    warfarin (COUMADIN ) 7.5 MG tablet Take 7.5-11.25 mg by mouth See admin instructions. Take 1 tablets (11.25mg ) by mouth every Monday, Tuesday, Wednesday, Thursday and Friday and take 1 tablet (7.5mg ) by mouth every Saturday and Sunday 04/16/2020: Reported as of 04/16/2020   [DISCONTINUED] doxepin  (SINEQUAN ) 10 MG capsule Take 10 mg by mouth at bedtime. (Patient not taking: Reported on 11/05/2023)    [DISCONTINUED] enoxaparin (LOVENOX) 60 MG/0.6ML injection Inject 60 mg into the skin every 12 (twelve) hours.    [DISCONTINUED] potassium chloride  (KLOR-CON ) 8 MEQ tablet Take 8 mEq by mouth daily. (Patient not taking: Reported on 11/05/2023)    [DISCONTINUED] predniSONE  (STERAPRED UNI-PAK 21 TAB) 10 MG (21) TBPK tablet Take by mouth daily. Take 6 tabs by mouth daily  for 1 days, then 5 tabs for 1 days, then 4 tabs for 1 days, then 3 tabs for 1 days, 2 tabs for 1 days, then 1 tab by mouth daily for 1 days    No facility-administered encounter medications on  file as of 01/26/2024.    Surgical History: Past Surgical History:  Procedure Laterality Date   COLONOSCOPY N/A 11/05/2023   Procedure: COLONOSCOPY;  Surgeon: Bruce Jenkins, Bruce POUR, MD;  Location: [facility] ENDOSCOPY;  Service: Gastroenterology;  Laterality: N/A;   COLONOSCOPY WITH PROPOFOL  N/A 12/29/2017   Procedure: COLONOSCOPY WITH PROPOFOL ;  Surgeon: Bruce Bruce PENNER, MD;  Location: [facility] ENDOSCOPY;  Service: Endoscopy;  Laterality: N/A;   EYE SURGERY Right  02/28/2014   FRACTURE SURGERY     IVC FILTER INSERTION  2008   MANDIBLE FRACTURE SURGERY     pr insert anter drainage dev w/o extraoc reservoir Right 02/27/2014   UMBILICAL GRANULOMA EXCISION  07/2013    Medical History: Past Medical History:  Diagnosis Date   Anxiety    Clotting disorder (HCC)    Diabetes mellitus without complication (HCC)    Diverticulitis of colon with perforation    Glaucoma    History of DVT in adulthood    History of pulmonary embolus (PE)    Hypertension    Sleep apnea     Family History: Non contributory to the present illness  Social History: Social History   Socioeconomic History   Marital status: Single    Spouse name: Not on file   Number of children: Not on file   Years of education: Not on file   Highest education level: Not on file  Occupational History   Not on file  Tobacco Use   Smoking status: Former   Smokeless tobacco: Never  Vaping Use   Vaping status: Never Used  Substance and Sexual Activity   Alcohol use: No   Drug use: No   Sexual activity: Not on file  Other Topics Concern   Not on file  Social History Narrative   Not on file   Social Drivers of Health   Financial Resource Strain: Low Risk  (07/17/2023)   Received from [facility]   Overall Financial Resource Strain (CARDIA)    Difficulty of Paying Living Expenses: Not hard at all  Food Insecurity: No Food Insecurity (07/17/2023)   Received from [facility]   Hunger Vital Sign    Within the past 12 months, you worried that your food would run out before you got the money to buy more.: Never true    Within the past 12 months, the food you bought just didn't last and you didn't have money to get more.: Never true  Transportation Needs: No Transportation Needs (07/17/2023)   Received from [facility] - Transportation    In the past 12 months, has lack of transportation kept you from medical appointments  or from getting medications?: No    Lack of Transportation (Non-Medical): No  Physical Activity: Not on file  Stress: Not on file  Social Connections: Not on file  Intimate Partner Violence: Not on file    Vital Signs: Blood pressure (!) 149/97, pulse 81, resp. rate 16, height 6' (1.829 m), weight (!) 320 lb (145.2 kg), SpO2 96%. Body mass index is 43.4 kg/m.    Examination: General Appearance: The patient is well-developed, well-nourished, and in no distress. Neck Circumference: 46 cm Skin: Gross inspection of skin unremarkable. Head: normocephalic, no gross deformities. Eyes: no gross deformities noted. ENT: ears appear grossly normal Neurologic: Alert and oriented. No involuntary movements.  STOP BANG RISK ASSESSMENT S (snore) Have you been told that you snore?     NO   T (tired) Are  you often tired, fatigued, or sleepy during the day?   NO  O (obstruction) Do you stop breathing, choke, or gasp during sleep? NO   P (pressure) Do you have or are you being treated for high blood pressure? YES   B (BMI) Is your body index greater than 35 kg/m? YES   A (age) Are you 86 years old or older? YES   N (neck) Do you have a neck circumference greater than 16 inches?   YES   G (gender) Are you a male? YES   TOTAL STOP/BANG "YES" ANSWERS 5       A STOP-Bang score of 2 or less is considered low risk, and a score of 5 or more is high risk for having either moderate or severe OSA. For people who score 3 or 4, doctors may need to perform further assessment to determine how likely they are to have OSA.         EPWORTH SLEEPINESS SCALE:  Scale:  (0)= no chance of dozing; (1)= slight chance of dozing; (2)= moderate chance of dozing; (3)= high chance of dozing  Chance  Situtation    Sitting and reading: 3    Watching TV: 1    Sitting Inactive in public: 2    As a passenger in car: 1      Lying down to rest: 2    Sitting and talking: 0    Sitting quielty after lunch: 1     In a car, stopped in traffic: 0   TOTAL SCORE:   10 out of 24    SLEEP STUDIES:  PSG (10/2012) AHI 15.7/hr, REM AHI 52.4/hr, min SpO2 85% Titration (10/2012) recommend bipap titration Titration (10/2012) BiPAP@ 12/8 cmH2O   CPAP COMPLIANCE DATA:  Date Range: 01/26/2023-01/25/2024  Average Daily Use: 5 hours 15 minutes  Median Use: 5 hours 21 minutes  Compliance for > 4 Hours: 87%  AHI: 1.4 respiratory events per hour  Days Used: 360/365 days  Mask Leak: 17.2  95th Percentile Pressure: 12/8         LABS: Recent Results (from the past 2160 hours)  Glucose, capillary     Status: Abnormal   Collection Time: 11/05/23  1:13 PM  Result Value Ref Range   Glucose-Capillary 110 (H) 70 - 99 mg/dL    Comment: Glucose reference range applies only to samples taken after fasting for at least 8 hours.    Radiology: No results found.  No results found.  No results found.    Assessment and Plan: Patient Active Problem List   Diagnosis Date Noted   OSA (obstructive sleep apnea) 01/26/2024   CPAP use counseling 01/26/2024   Morbid obesity (HCC) 01/26/2024   Diverticulitis of colon with perforation 04/16/2020   Anxiety 03/18/2018   Hyperlipidemia 03/18/2018   Type 2 diabetes mellitus without complication, without long-term current use of insulin (HCC) 05/10/2016   Open angle with borderline findings and high glaucoma risk in left eye 12/20/2014   Primary open angle glaucoma of right eye, severe stage 09/13/2014   Clotting disorder (HCC) 09/07/2014   Essential hypertension 09/07/2014    1. OSA (obstructive sleep apnea) (Primary) The patient does tolerate PAP and reports  benefit from PAP use. Patient's machine is past end of life, out of warranty and needs to be replaced.  The patient was reminded how to clean equipment and advised to replace supplies routinely. The patient was also counselled on weight loss. The compliance is very good. The AHI  is  1.4.   OSA- treated with bipap, well controlled. Replace machine. F/u after setup.   2. CPAP use counseling CPAP Counseling: had a lengthy discussion with the patient regarding the importance of PAP therapy in management of the sleep apnea. Patient appears to understand the risk factor reduction and also understands the risks associated with untreated sleep apnea. Patient will try to make a good faith effort to remain compliant with therapy. Also instructed the patient on proper cleaning of the device including the water must be changed daily if possible and use of distilled water is preferred. Patient understands that the machine should be regularly cleaned with appropriate recommended cleaning solutions that do not damage the PAP machine for example given white vinegar and water rinses. Other methods such as ozone treatment may not be as good as these simple methods to achieve cleaning.   3. Morbid obesity (HCC) Obesity Counseling: Had a lengthy discussion regarding patients BMI and weight issues. Patient was instructed on portion control as well as increased activity. Also discussed caloric restrictions with trying to maintain intake less than 2000 Kcal. Discussions were made in accordance with the 5As of weight management. Simple actions such as not eating late and if able to, taking a walk is suggested.     General Counseling: I have discussed the findings of the evaluation and examination with Mahkai.  I have also discussed any further diagnostic evaluation thatmay be needed or ordered today. Mohmed verbalizes understanding of the findings of todays visit. We also reviewed his medications today and discussed drug interactions and side effects including but not limited excessive drowsiness and altered mental states. We also discussed that there is always a risk not just to him but also people around him. he has been encouraged to call the office with any questions or concerns that should arise  related to todays visit.  No orders of the defined types were placed in this encounter.       I have personally obtained a history, examined the patient, evaluated laboratory and imaging results, formulated the assessment and plan and placed orders. This patient was seen today by Lauraine Lay, PA-C in collaboration with Dr. Elfreda Bathe.   Elfreda DELENA Bathe, MD [facility] Diplomate ABMS Pulmonary Critical Care Medicine and Sleep Medicine

## 2024-01-26 ENCOUNTER — Ambulatory Visit (INDEPENDENT_AMBULATORY_CARE_PROVIDER_SITE_OTHER): Payer: Self-pay | Admitting: Internal Medicine

## 2024-01-26 VITALS — BP 149/97 | HR 81 | Resp 16 | Ht 72.0 in | Wt 320.0 lb

## 2024-01-26 DIAGNOSIS — Z7189 Other specified counseling: Secondary | ICD-10-CM

## 2024-01-26 DIAGNOSIS — G4733 Obstructive sleep apnea (adult) (pediatric): Secondary | ICD-10-CM | POA: Diagnosis not present

## 2024-01-26 NOTE — Patient Instructions (Signed)

## 2024-04-16 NOTE — Progress Notes (Signed)
 [facility] [address] [place], [place] [address]  Pulmonary Sleep Medicine   Office Visit Note  Patient Name: Bruce Jenkins. DOB: 12-May-1966 MRN [id number]    Chief Complaint: Obstructive Sleep Apnea visit  Brief History:  Bruce Jenkins is seen today for a 60 day follow up visit for BiPAP@ 12/8 cmH2O. The patient has a 11 year history of sleep apnea. Patient is using PAP nightly.  The patient feels rested after sleeping with PAP.  The patient reports benefiting from PAP use. Reported sleepiness is  improved and the Epworth Sleepiness Score is 7 out of 24. The patient does take naps on the weekends  for an hour each day. The patient complains of the following: no complaints at this time.  The compliance download shows 97% compliance with an average use time of 5 hours 29 minutes. The AHI is 1.3.  The patient does not complain of limb movements disrupting sleep. The patient continues to require PAP therapy in order to eliminate sleep apnea.   ROS  General: (-) fever, (-) chills, (-) night sweat Nose and Sinuses: (-) nasal stuffiness or itchiness, (-) postnasal drip, (-) nosebleeds, (-) sinus trouble. Mouth and Throat: (-) sore throat, (-) hoarseness. Neck: (-) swollen glands, (-) enlarged thyroid, (-) neck pain. Respiratory: - cough, - shortness of breath, - wheezing. Neurologic: - numbness, - tingling. Psychiatric: + anxiety, - depression   Current Medication: Outpatient Encounter Medications as of 04/19/2024  Medication Sig Note   OZEMPIC, 2 MG/DOSE, 8 MG/3ML SOPN Inject 2 mg into the skin.    atorvastatin (LIPITOR) 40 MG tablet Take 40 mg by mouth daily.    busPIRone (BUSPAR) 5 MG tablet Take 5 mg by mouth 2 (two) times daily.    chlorthalidone (HYGROTON) 25 MG tablet Take 25 mg by mouth daily.    escitalopram  (LEXAPRO ) 20 MG tablet Take 20 mg by mouth daily.     ibuprofen  (ADVIL ) 600 MG tablet Take 1 tablet (600 mg total) by mouth every 6 (six) hours as needed for  mild pain. (Patient not taking: Reported on 05/04/2020)    latanoprost  (XALATAN ) 0.005 % ophthalmic solution Place 1 drop into both eyes at bedtime.     potassium chloride  SA (K-DUR,KLOR-CON ) 20 MEQ tablet Take 20 mEq by mouth daily.    RHOPRESSA 0.02 % SOLN Place 1 drop into both eyes every evening.    warfarin (COUMADIN ) 7.5 MG tablet Take 7.5-11.25 mg by mouth See admin instructions. Take 1 tablets (11.25mg ) by mouth every Monday, Tuesday, Wednesday, Thursday and Friday and take 1 tablet (7.5mg ) by mouth every Saturday and Sunday 04/16/2020: Reported as of 04/16/2020   [DISCONTINUED] OZEMPIC, 1 MG/DOSE, 4 MG/3ML SOPN INJECT 0.75 MLS (1 MG TOTAL) SUBCUTANEOUSLY ONCE A WEEK. (PA DENIED)    No facility-administered encounter medications on file as of 04/19/2024.    Surgical History: Past Surgical History:  Procedure Laterality Date   COLONOSCOPY N/A 11/05/2023   Procedure: COLONOSCOPY;  Surgeon: Toledo, Ladell POUR, MD;  Location: [facility] ENDOSCOPY;  Service: Gastroenterology;  Laterality: N/A;   COLONOSCOPY WITH PROPOFOL  N/A 12/29/2017   Procedure: COLONOSCOPY WITH PROPOFOL ;  Surgeon: Gaylyn Gladis PENNER, MD;  Location: [facility] ENDOSCOPY;  Service: Endoscopy;  Laterality: N/A;   EYE SURGERY Right 02/28/2014   FRACTURE SURGERY     IVC FILTER INSERTION  2008   MANDIBLE FRACTURE SURGERY     pr insert anter drainage dev w/o extraoc reservoir Right 02/27/2014   UMBILICAL GRANULOMA EXCISION  07/2013  Medical History: Past Medical History:  Diagnosis Date   Anxiety    Clotting disorder    Diabetes mellitus without complication (HCC)    Diverticulitis of colon with perforation    Glaucoma    History of DVT in adulthood    History of pulmonary embolus (PE)    Hypertension    Sleep apnea     Family History: Non contributory to the present illness  Social History: Social History   Socioeconomic History   Marital status: Single    Spouse name: Not on file   Number of children: Not on file    Years of education: Not on file   Highest education level: Not on file  Occupational History   Not on file  Tobacco Use   Smoking status: Former   Smokeless tobacco: Never  Vaping Use   Vaping status: Never Used  Substance and Sexual Activity   Alcohol use: No   Drug use: No   Sexual activity: Not on file  Other Topics Concern   Not on file  Social History Narrative   Not on file   Social Drivers of Health   Financial Resource Strain: Low Risk  (07/17/2023)   Received from [facility]   Overall Financial Resource Strain (CARDIA)    Difficulty of Paying Living Expenses: Not hard at all  Food Insecurity: No Food Insecurity (07/17/2023)   Received from [facility]   Hunger Vital Sign    Within the past 12 months, you worried that your food would run out before you got the money to buy more.: Never true    Within the past 12 months, the food you bought just didn't last and you didn't have money to get more.: Never true  Transportation Needs: No Transportation Needs (07/17/2023)   Received from [facility] - Transportation    In the past 12 months, has lack of transportation kept you from medical appointments or from getting medications?: No    Lack of Transportation (Non-Medical): No  Physical Activity: Not on file  Stress: Not on file  Social Connections: Not on file  Intimate Partner Violence: Not on file    Vital Signs: Blood pressure 124/78, pulse 83, resp. rate 16, height 6' (1.829 m), weight (!) 314 lb (142.4 kg), SpO2 95%. Body mass index is 42.59 kg/m.    Examination: General Appearance: The patient is well-developed, well-nourished, and in no distress. Neck Circumference: 52 cm Skin: Gross inspection of skin unremarkable. Head: normocephalic, no gross deformities. Eyes: no gross deformities noted. ENT: ears appear grossly normal Neurologic: Alert and oriented. No involuntary movements.  STOP BANG  RISK ASSESSMENT S (snore) Have you been told that you snore?     NO   T (tired) Are you often tired, fatigued, or sleepy during the day?   NO  O (obstruction) Do you stop breathing, choke, or gasp during sleep? NO   P (pressure) Do you have or are you being treated for high blood pressure? YES   B (BMI) Is your body index greater than 35 kg/m? YES   A (age) Are you 9 years old or older? YES   N (neck) Do you have a neck circumference greater than 16 inches?   YES   G (gender) Are you a male? YES   TOTAL STOP/BANG "YES" ANSWERS 5       A STOP-Bang score of 2 or less is considered low risk, and a score  of 5 or more is high risk for having either moderate or severe OSA. For people who score 3 or 4, doctors may need to perform further assessment to determine how likely they are to have OSA.         EPWORTH SLEEPINESS SCALE:  Scale:  (0)= no chance of dozing; (1)= slight chance of dozing; (2)= moderate chance of dozing; (3)= high chance of dozing  Chance  Situtation    Sitting and reading: 3    Watching TV: 1    Sitting Inactive in public: 1    As a passenger in car: 0      Lying down to rest: 1    Sitting and talking: 0    Sitting quielty after lunch: 1    In a car, stopped in traffic: 0   TOTAL SCORE:   7 out of 24    SLEEP STUDIES:  PSG (10/2012) AHI 15.7/hr, REM AHI 52.4/hr, min SpO2 85% Titration (10/2012) Recommend bipap titration Titration (10/2012) BiPAP@ 12/8 cmH2O   CPAP COMPLIANCE DATA:  Date Range: 02/17/2024-04/14/2024  Average Daily Use: 5 hours 29 minutes  Median Use: 5 hours 34 minutes  Compliance for > 4 Hours: 97%  AHI: 1.3 respiratory events per hour  Days Used: 58/58 days  Mask Leak: 13.2  95th Percentile Pressure: 12/8         LABS: No results found for this or any previous visit (from the past 2160 hours).  Radiology: No results found.  No results found.  No results found.    Assessment and Plan: Patient  Active Problem List   Diagnosis Date Noted   OSA (obstructive sleep apnea) 01/26/2024   CPAP use counseling 01/26/2024   Morbid obesity (HCC) 01/26/2024   Diverticulitis of colon with perforation 04/16/2020   Anxiety 03/18/2018   Hyperlipidemia 03/18/2018   Type 2 diabetes mellitus without complication, without long-term current use of insulin (HCC) 05/10/2016   Open angle with borderline findings and high glaucoma risk in left eye 12/20/2014   Primary open angle glaucoma of right eye, severe stage 09/13/2014   Clotting disorder 09/07/2014   Essential hypertension 09/07/2014   1. OSA (obstructive sleep apnea) (Primary) The patient does tolerate PAP and reports  benefit from PAP use. The patient was reminded how to clean equipment and advised to replace supplies routinely. The patient was also counselled on weight loss. The compliance is excellent. The AHI is 1.3.   OSA- well controlled on Bipap. Continue excellent compliance.   2. Encounter for BiPAP use counseling PAP Counseling: had a lengthy discussion with the patient regarding the importance of PAP therapy in management of the sleep apnea. Patient appears to understand the risk factor reduction and also understands the risks associated with untreated sleep apnea. Patient will try to make a good faith effort to remain compliant with therapy. Also instructed the patient on proper cleaning of the device including the water must be changed daily if possible and use of distilled water is preferred. Patient understands that the machine should be regularly cleaned with appropriate recommended cleaning solutions that do not damage the PAP machine for example given white vinegar and water rinses. Other methods such as ozone treatment may not be as good as these simple methods to achieve cleaning.   3. Morbid obesity (HCC) Obesity Counseling: Had a lengthy discussion regarding patients BMI and weight issues. Patient was instructed on portion  control as well as increased activity. Also discussed caloric restrictions with trying to maintain  intake less than 2000 Kcal. Discussions were made in accordance with the 5As of weight management. Simple actions such as not eating late and if able to, taking a walk is suggested.   4. Insomnia, unspecified type Patient reports rumination. Discussed active thinking technique which he will try, as well as sleep hygiene.     General Counseling: I have discussed the findings of the evaluation and examination with Bruce Jenkins.  I have also discussed any further diagnostic evaluation thatmay be needed or ordered today. Bruce Jenkins verbalizes understanding of the findings of todays visit. We also reviewed his medications today and discussed drug interactions and side effects including but not limited excessive drowsiness and altered mental states. We also discussed that there is always a risk not just to him but also people around him. he has been encouraged to call the office with any questions or concerns that should arise related to todays visit.  No orders of the defined types were placed in this encounter.       I have personally obtained a history, examined the patient, evaluated laboratory and imaging results, formulated the assessment and plan and placed orders. This patient was seen today by Lauraine Lay, PA-C in collaboration with Dr. Elfreda Jenkins.   Bruce DELENA Bathe, MD [facility] Diplomate ABMS Pulmonary Critical Care Medicine and Sleep Medicine

## 2024-04-19 ENCOUNTER — Ambulatory Visit: Admitting: Internal Medicine

## 2024-04-19 VITALS — BP 124/78 | HR 83 | Resp 16 | Ht 72.0 in | Wt 314.0 lb

## 2024-04-19 DIAGNOSIS — G4733 Obstructive sleep apnea (adult) (pediatric): Secondary | ICD-10-CM

## 2024-04-19 DIAGNOSIS — Z7189 Other specified counseling: Secondary | ICD-10-CM | POA: Insufficient documentation

## 2024-04-19 DIAGNOSIS — G4700 Insomnia, unspecified: Secondary | ICD-10-CM | POA: Insufficient documentation

## 2024-04-19 NOTE — Patient Instructions (Signed)
